# Patient Record
Sex: Male | Born: 1970
Health system: Southern US, Community
[De-identification: ages and names within clinical notes are randomized; demographics above are authoritative.]

## PROBLEM LIST (undated history)

## (undated) DIAGNOSIS — I219 Acute myocardial infarction, unspecified: Secondary | ICD-10-CM

## (undated) DIAGNOSIS — I509 Heart failure, unspecified: Secondary | ICD-10-CM

## (undated) DIAGNOSIS — I10 Essential (primary) hypertension: Secondary | ICD-10-CM

## (undated) DIAGNOSIS — E119 Type 2 diabetes mellitus without complications: Secondary | ICD-10-CM

---

## 2016-06-29 DIAGNOSIS — M109 Gout, unspecified: Secondary | ICD-10-CM | POA: Diagnosis not present

## 2016-06-29 DIAGNOSIS — I509 Heart failure, unspecified: Secondary | ICD-10-CM | POA: Diagnosis not present

## 2016-06-29 DIAGNOSIS — Z79899 Other long term (current) drug therapy: Secondary | ICD-10-CM | POA: Diagnosis not present

## 2016-06-29 DIAGNOSIS — Z7982 Long term (current) use of aspirin: Secondary | ICD-10-CM | POA: Diagnosis not present

## 2016-06-29 DIAGNOSIS — M7989 Other specified soft tissue disorders: Secondary | ICD-10-CM | POA: Diagnosis not present

## 2016-06-29 DIAGNOSIS — I110 Hypertensive heart disease with heart failure: Secondary | ICD-10-CM | POA: Diagnosis not present

## 2016-06-29 DIAGNOSIS — M25572 Pain in left ankle and joints of left foot: Secondary | ICD-10-CM | POA: Diagnosis not present

## 2016-06-29 DIAGNOSIS — M79672 Pain in left foot: Secondary | ICD-10-CM | POA: Diagnosis not present

## 2016-12-15 DIAGNOSIS — I428 Other cardiomyopathies: Secondary | ICD-10-CM | POA: Diagnosis not present

## 2016-12-15 DIAGNOSIS — E669 Obesity, unspecified: Secondary | ICD-10-CM | POA: Diagnosis not present

## 2016-12-15 DIAGNOSIS — R0602 Shortness of breath: Secondary | ICD-10-CM | POA: Diagnosis not present

## 2016-12-15 DIAGNOSIS — Z6836 Body mass index (BMI) 36.0-36.9, adult: Secondary | ICD-10-CM | POA: Diagnosis not present

## 2016-12-15 DIAGNOSIS — R079 Chest pain, unspecified: Secondary | ICD-10-CM | POA: Diagnosis not present

## 2016-12-15 DIAGNOSIS — Z79899 Other long term (current) drug therapy: Secondary | ICD-10-CM | POA: Diagnosis not present

## 2016-12-15 DIAGNOSIS — I5022 Chronic systolic (congestive) heart failure: Secondary | ICD-10-CM | POA: Diagnosis not present

## 2016-12-15 DIAGNOSIS — R0600 Dyspnea, unspecified: Secondary | ICD-10-CM | POA: Diagnosis not present

## 2016-12-15 DIAGNOSIS — I5020 Unspecified systolic (congestive) heart failure: Secondary | ICD-10-CM | POA: Diagnosis not present

## 2016-12-15 DIAGNOSIS — I10 Essential (primary) hypertension: Secondary | ICD-10-CM | POA: Diagnosis not present

## 2016-12-20 DIAGNOSIS — I10 Essential (primary) hypertension: Secondary | ICD-10-CM | POA: Diagnosis not present

## 2016-12-20 DIAGNOSIS — E785 Hyperlipidemia, unspecified: Secondary | ICD-10-CM | POA: Diagnosis not present

## 2016-12-20 DIAGNOSIS — I429 Cardiomyopathy, unspecified: Secondary | ICD-10-CM | POA: Diagnosis not present

## 2016-12-20 DIAGNOSIS — R0609 Other forms of dyspnea: Secondary | ICD-10-CM | POA: Diagnosis not present

## 2016-12-30 DIAGNOSIS — I428 Other cardiomyopathies: Secondary | ICD-10-CM | POA: Diagnosis not present

## 2016-12-30 DIAGNOSIS — R072 Precordial pain: Secondary | ICD-10-CM | POA: Diagnosis not present

## 2016-12-30 DIAGNOSIS — R6889 Other general symptoms and signs: Secondary | ICD-10-CM | POA: Diagnosis not present

## 2016-12-30 DIAGNOSIS — I429 Cardiomyopathy, unspecified: Secondary | ICD-10-CM | POA: Diagnosis not present

## 2016-12-30 DIAGNOSIS — E785 Hyperlipidemia, unspecified: Secondary | ICD-10-CM | POA: Diagnosis not present

## 2016-12-30 DIAGNOSIS — R0609 Other forms of dyspnea: Secondary | ICD-10-CM | POA: Diagnosis not present

## 2016-12-30 DIAGNOSIS — I2510 Atherosclerotic heart disease of native coronary artery without angina pectoris: Secondary | ICD-10-CM | POA: Diagnosis not present

## 2016-12-30 DIAGNOSIS — I517 Cardiomegaly: Secondary | ICD-10-CM | POA: Diagnosis not present

## 2016-12-30 DIAGNOSIS — Z7982 Long term (current) use of aspirin: Secondary | ICD-10-CM | POA: Diagnosis not present

## 2016-12-30 DIAGNOSIS — I119 Hypertensive heart disease without heart failure: Secondary | ICD-10-CM | POA: Diagnosis not present

## 2017-02-17 ENCOUNTER — Emergency Department (HOSPITAL_COMMUNITY)
Admission: EM | Admit: 2017-02-17 | Discharge: 2017-02-17 | Disposition: A | Discharge status: Home / Self Care | Payer: BLUE CROSS/BLUE SHIELD | Attending: Emergency Medicine | Admitting: Emergency Medicine

## 2017-02-17 ENCOUNTER — Encounter (HOSPITAL_COMMUNITY): Payer: Self-pay | Admitting: Emergency Medicine

## 2017-02-17 ENCOUNTER — Ambulatory Visit (HOSPITAL_COMMUNITY): Admission: EM | Admit: 2017-02-17 | Discharge: 2017-02-17 | Payer: BLUE CROSS/BLUE SHIELD | Source: Home / Self Care

## 2017-02-17 DIAGNOSIS — T783XXA Angioneurotic edema, initial encounter: Secondary | ICD-10-CM | POA: Diagnosis not present

## 2017-02-17 HISTORY — DX: Heart failure, unspecified: I50.9

## 2017-02-17 HISTORY — DX: Essential (primary) hypertension: I10

## 2017-02-17 HISTORY — DX: Type 2 diabetes mellitus without complications: E11.9

## 2017-02-17 HISTORY — DX: Acute myocardial infarction, unspecified: I21.9

## 2017-02-17 MED ORDER — METHYLPREDNISOLONE SODIUM SUCC 125 MG IJ SOLR
125.0000 mg | Freq: Once | INTRAMUSCULAR | Status: AC
  Administered 2017-02-17: 125 mg via INTRAVENOUS
  Filled 2017-02-17: qty 2

## 2017-02-17 MED ORDER — METFORMIN HCL 500 MG PO TABS: 500.0000 mg | ORAL_TABLET | Freq: Two times a day (BID) | ORAL | 1 refills | Status: DC

## 2017-02-17 MED ORDER — AMLODIPINE BESYLATE 5 MG PO TABS: 5.0000 mg | ORAL_TABLET | Freq: Every day | ORAL | 1 refills | Status: DC

## 2017-02-17 MED ORDER — SODIUM CHLORIDE 0.9 % IV BOLUS (SEPSIS)
1000.0000 mL | Freq: Once | INTRAVENOUS | Status: AC
  Administered 2017-02-17: 1000 mL via INTRAVENOUS

## 2017-02-17 NOTE — ED Triage Notes (Signed)
Pt to ER for acute angioedema onset on awakening this morning. States has been on lisinopril for "years." states took a benadryl 9 am this morning as well as 1 pm with some improvement. Went to [facility] and was sent here. Airway intact.

## 2017-02-17 NOTE — ED Provider Notes (Signed)
[facility] EMERGENCY DEPARTMENT Provider Note CSN: [id number] Arrival date & time: 02/17/17  1844 History   Chief Complaint Chief Complaint  Patient presents with  . Angioedema   HPI Odilon Vandekamp is a 46 y.o. male past medical history of hypertension who presented with angioedema.  Patient noted symptoms upon awakening this morning approximately 12 hours ago.  Patient's family member states that she gave patient 2 Benadryl and told him to get dressed to go to the hospital however patient went to sleep instead.  Upon waking up about noon patient swelling had worsened however he still refused to come to the hospital.  Patient finally agreed to go to urgent care this evening where he was seen and evaluated and advised to come to the emergency department for further evaluation.  At this time patient's daughter states that the swelling has significantly improved compared to what it was earlier.  The patient denies that he has had any associated chest pain, shortness of breath, difficulty breathing, throat swelling, itchy or scratchy throat.  Denies any associated rashes, nausea, vomiting, abdominal pain, or diarrhea.  He has not had any recent fevers or chills.  Patient does take lisinopril and has no known allergies.  HPI  History reviewed. No pertinent past medical history.  There are no active problems to display for this patient.  History reviewed. No pertinent surgical history.  Home Medications    Prior to Admission medications   Not on File    Family History History reviewed. No pertinent family history.  Social History Social History  Substance Use Topics  . Smoking status: Never Smoker  . Smokeless tobacco: Never Used  . Alcohol use Not on file   Allergies   Patient has no allergy information on record.  Review of Systems Review of Systems  Constitutional: Negative for chills and fever.  HENT: Positive for facial swelling. Negative for drooling, ear  pain and sore throat.   Eyes: Negative for pain and visual disturbance.  Respiratory: Negative for cough, choking, chest tightness, shortness of breath and stridor.   Cardiovascular: Negative for chest pain and palpitations.  Gastrointestinal: Negative for abdominal pain and vomiting.  Genitourinary: Negative for dysuria and hematuria.  Musculoskeletal: Negative for arthralgias and back pain.  Skin: Negative for color change and rash.  Neurological: Negative for seizures and syncope.  All other systems reviewed and are negative.  Physical Exam Updated Vital Signs BP 118/74   Pulse 71   Temp 97.9 F (36.6 C) (Oral)   Resp 18   SpO2 95%   Physical Exam  Constitutional: He appears well-developed and well-nourished.  HENT:  Head: Normocephalic and atraumatic.  Mouth/Throat: No uvula swelling.  Significant swelling of the lips without involvement of the tongue or oral pharynx no swelling of the uvula noted.  Patient with a patent airway  Eyes: Conjunctivae are normal.  Neck: Neck supple.  Cardiovascular: Normal rate and regular rhythm.   No murmur heard. Pulmonary/Chest: Effort normal and breath sounds normal. No respiratory distress.  Without difficulty breathing no signs of respiratory distress at this time  Abdominal: Soft. There is no tenderness.  Musculoskeletal: He exhibits no edema.  Neurological: He is alert.  Skin: Skin is warm and dry.  Psychiatric: He has a normal mood and affect.  Nursing note and vitals reviewed.  ED Treatments / Results  Labs (all labs ordered are listed, but only abnormal results are displayed) Labs Reviewed - No data to display  EKG  EKG Interpretation  None      Radiology No results found.  Procedures Procedures (including critical care time)  Medications Ordered in ED Medications  methylPREDNISolone sodium succinate (SOLU-MEDROL) 125 mg/2 mL injection 125 mg (not administered)  sodium chloride 0.9 % bolus 1,000 mL (not  administered)   Initial Impression / Assessment and Plan / ED Course  I have reviewed the triage vital signs and the nursing notes.  Pertinent labs & imaging results that were available during my care of the patient were reviewed by me and considered in my medical decision making (see chart for details).  Dorien ChihuahuaBenny Woloszyn is a 46 y.o. male with PMH of HTN who presents who presents with facial swelling concerning for angioedema.  Patient has not self medicine at home with Benadryl.  Symptoms have improved since the onset early this morning.  Patient given IV fluids and solid nodule in the emergency department and observed for 2 hours.   Patient without any difficulty breathing, no respiratory distress or signs of impending airway collapse.  Discussed discontinuing lisinopril with the patient and following up his provider for alternative hypertensive medication.  Will prescribe the patient Norvasc in the time being until he is able to his PCP for blood pressure control.  Patient also given Rx for metformin.  Patient discharged in stable condition.  Advised to return to emergency department for any new or worsening symptoms.  Advised to follow-up with his PCP in 3-5 days.  Final Clinical Impressions(s) / ED Diagnoses   Final diagnoses:  Angioedema, initial encounter    New Prescriptions New Prescriptions   No medications on file     Lamont SnowballGarza, Angala Hilgers, MD 02/19/17 1315    Raeford RazorKohut, Stephen, MD 03/04/17 [phone]

## 2017-02-17 NOTE — ED Notes (Signed)
Patient able to ambulate independently to bed from wheelchair, gait steady and even, NAD

## 2017-02-21 DIAGNOSIS — I10 Essential (primary) hypertension: Secondary | ICD-10-CM | POA: Diagnosis not present

## 2017-02-21 DIAGNOSIS — I5022 Chronic systolic (congestive) heart failure: Secondary | ICD-10-CM | POA: Diagnosis not present

## 2017-02-21 DIAGNOSIS — E669 Obesity, unspecified: Secondary | ICD-10-CM | POA: Diagnosis not present

## 2017-02-21 DIAGNOSIS — E119 Type 2 diabetes mellitus without complications: Secondary | ICD-10-CM | POA: Diagnosis not present

## 2017-02-21 DIAGNOSIS — I119 Hypertensive heart disease without heart failure: Secondary | ICD-10-CM | POA: Diagnosis not present

## 2017-02-24 ENCOUNTER — Emergency Department (HOSPITAL_COMMUNITY)
Admission: EM | Admit: 2017-02-24 | Discharge: 2017-02-25 | Disposition: A | Discharge status: Home / Self Care | Payer: BLUE CROSS/BLUE SHIELD | Attending: Emergency Medicine | Admitting: Emergency Medicine

## 2017-02-24 ENCOUNTER — Encounter (HOSPITAL_COMMUNITY): Payer: Self-pay

## 2017-02-24 ENCOUNTER — Emergency Department (HOSPITAL_COMMUNITY): Payer: BLUE CROSS/BLUE SHIELD

## 2017-02-24 ENCOUNTER — Other Ambulatory Visit: Payer: Self-pay

## 2017-02-24 DIAGNOSIS — I110 Hypertensive heart disease with heart failure: Secondary | ICD-10-CM | POA: Diagnosis not present

## 2017-02-24 DIAGNOSIS — R0789 Other chest pain: Secondary | ICD-10-CM

## 2017-02-24 DIAGNOSIS — E119 Type 2 diabetes mellitus without complications: Secondary | ICD-10-CM | POA: Insufficient documentation

## 2017-02-24 DIAGNOSIS — R0602 Shortness of breath: Secondary | ICD-10-CM | POA: Diagnosis not present

## 2017-02-24 DIAGNOSIS — Z7984 Long term (current) use of oral hypoglycemic drugs: Secondary | ICD-10-CM | POA: Diagnosis not present

## 2017-02-24 DIAGNOSIS — R9431 Abnormal electrocardiogram [ECG] [EKG]: Secondary | ICD-10-CM | POA: Diagnosis not present

## 2017-02-24 DIAGNOSIS — I252 Old myocardial infarction: Secondary | ICD-10-CM | POA: Diagnosis not present

## 2017-02-24 DIAGNOSIS — Z79899 Other long term (current) drug therapy: Secondary | ICD-10-CM | POA: Diagnosis not present

## 2017-02-24 DIAGNOSIS — F10920 Alcohol use, unspecified with intoxication, uncomplicated: Secondary | ICD-10-CM

## 2017-02-24 DIAGNOSIS — R4182 Altered mental status, unspecified: Secondary | ICD-10-CM | POA: Diagnosis present

## 2017-02-24 DIAGNOSIS — I509 Heart failure, unspecified: Secondary | ICD-10-CM | POA: Diagnosis not present

## 2017-02-24 DIAGNOSIS — R079 Chest pain, unspecified: Secondary | ICD-10-CM | POA: Insufficient documentation

## 2017-02-24 DIAGNOSIS — R402441 Other coma, without documented Glasgow coma scale score, or with partial score reported, in the field [EMT or ambulance]: Secondary | ICD-10-CM | POA: Diagnosis not present

## 2017-02-24 LAB — MAGNESIUM: Magnesium: 1.6 mg/dL — ABNORMAL LOW (ref 1.7–2.4)

## 2017-02-24 LAB — CBC WITH DIFFERENTIAL/PLATELET
BASOS ABS: 0 10*3/uL (ref 0.0–0.1)
Basophils Relative: 0 %
Eosinophils Absolute: 0.2 10*3/uL (ref 0.0–0.7)
Eosinophils Relative: 3 %
HEMATOCRIT: 41.7 % (ref 39.0–52.0)
HEMOGLOBIN: 13.6 g/dL (ref 13.0–17.0)
LYMPHS ABS: 3.6 10*3/uL (ref 0.7–4.0)
LYMPHS PCT: 49 %
MCH: 26.9 pg (ref 26.0–34.0)
MCHC: 32.6 g/dL (ref 30.0–36.0)
MCV: 82.4 fL (ref 78.0–100.0)
Monocytes Absolute: 0.5 10*3/uL (ref 0.1–1.0)
Monocytes Relative: 7 %
NEUTROS ABS: 3 10*3/uL (ref 1.7–7.7)
NEUTROS PCT: 41 %
PLATELETS: 182 10*3/uL (ref 150–400)
RBC: 5.06 MIL/uL (ref 4.22–5.81)
RDW: 14 % (ref 11.5–15.5)
WBC: 7.4 10*3/uL (ref 4.0–10.5)

## 2017-02-24 LAB — COMPREHENSIVE METABOLIC PANEL
ALBUMIN: 3.3 g/dL — AB (ref 3.5–5.0)
ALT: 11 U/L — AB (ref 17–63)
AST: 10 U/L — AB (ref 15–41)
Alkaline Phosphatase: 63 U/L (ref 38–126)
Anion gap: 9 (ref 5–15)
BILIRUBIN TOTAL: 0.6 mg/dL (ref 0.3–1.2)
BUN: 11 mg/dL (ref 6–20)
CHLORIDE: 110 mmol/L (ref 101–111)
CO2: 20 mmol/L — ABNORMAL LOW (ref 22–32)
Calcium: 7.3 mg/dL — ABNORMAL LOW (ref 8.9–10.3)
Creatinine, Ser: 1.18 mg/dL (ref 0.61–1.24)
GFR calc Af Amer: >60 mL/min (ref >60)
Glucose, Bld: 99 mg/dL (ref 65–99)
POTASSIUM: 2.8 mmol/L — AB (ref 3.5–5.1)
Sodium: 139 mmol/L (ref 135–145)
TOTAL PROTEIN: 5.9 g/dL — AB (ref 6.5–8.1)

## 2017-02-24 LAB — I-STAT TROPONIN, ED: TROPONIN I, POC: 0 ng/mL (ref 0.00–0.08)

## 2017-02-24 LAB — CBG MONITORING, ED: Glucose-Capillary: 104 mg/dL — ABNORMAL HIGH (ref 65–99)

## 2017-02-24 LAB — ETHANOL: Alcohol, Ethyl (B): 266 mg/dL — ABNORMAL HIGH (ref <10)

## 2017-02-24 MED ORDER — POTASSIUM CHLORIDE CRYS ER 20 MEQ PO TBCR
40.0000 meq | EXTENDED_RELEASE_TABLET | Freq: Once | ORAL | Status: AC
  Administered 2017-02-24: 40 meq via ORAL
  Filled 2017-02-24: qty 2

## 2017-02-24 NOTE — ED Triage Notes (Addendum)
Pt arrives to ed via ems with complaints of "unresponsive". Friend at bedside state he drank about 4oz of etoh when he started complaining of chest pain and became unresponsive. In route to ed ems states pt was only responsive to painful stimuli. On arrival pt is alert but confused and asking repetitive questions.  4mg  zofran given pta.  Pt denies pain on arrival to ED and does not remember having pain pta

## 2017-02-24 NOTE — ED Provider Notes (Signed)
[facility] Provider Note   CSN: [id number] Arrival date & time: 02/24/17  2110   Mental status history is obtained from patient's fianc who accompanies him  History   Chief Complaint Chief Complaint  Patient presents with  . Altered Mental Status    HPI Dustin Nicholson is a 46 y.o. male.  HPI Patient drank several shots of liquor earlier tonight approximately 8:15 PM.  He went upstairs and was found to be unresponsive.  Upon awakening patient complained of chest pain.  His fiance says he "never drinks alcohol".  No treatment prior to coming here.  Nothing makes symptoms better or worse Past Medical History:  Diagnosis Date  . CHF (congestive heart failure) (HCC)   . 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 Hypertension   . 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   History reviewed. No pertinent surgical history.     Home Medications    Prior to Admission medications   Medication Sig Start Date End Date Taking? Authorizing Provider  amLODipine (NORVASC) 5 MG tablet Take 1 tablet (5 mg total) by mouth daily. 02/17/17 04/18/17  Lamont SnowballGarza, Anna, MD  metFORMIN (GLUCOPHAGE) 500 MG tablet Take 1 tablet (500 mg total) by mouth 2 (two) times daily with a meal. 02/17/17 04/18/17  Lamont SnowballGarza, Anna, MD   Hydralazine, spironolactone carvedilol, Lasix, atorvastatin, amlodipine, isosorbide mononitrate ER Family History No family history on file.  Social History Social History   Tobacco Use  . Smoking status: Never Smoker  . Smokeless tobacco: Never Used  Substance Use Topics  . Alcohol use: Yes  . Drug use: No     Allergies   Shellfish allergy   Review of Systems Review of Systems  Unable to perform ROS: Mental status change  Cardiovascular: Positive for chest pain.  Allergic/Immunologic: Positive for immunocompromised  state.       Diabetic     Physical Exam Updated Vital Signs BP 128/87   Pulse 72   Temp 98.8 F (37.1 C) (Oral)   Resp 14   Ht 5\' 11"  (1.803 m)   Wt 116.1 kg (256 lb)   SpO2 100%   BMI 35.70 kg/m   Physical Exam  Constitutional: He appears well-developed and well-nourished.  Sleepy arousable to tactile stimulus  HENT:  Head: Normocephalic and atraumatic.  Eyes: Conjunctivae are normal. Pupils are equal, round, and reactive to light.  Neck: Neck supple. No tracheal deviation present. No thyromegaly present.  Cardiovascular: Normal rate and regular rhythm.  No murmur heard. Pulmonary/Chest: Effort normal and breath sounds normal.  Abdominal: Soft. Bowel sounds are normal. He exhibits no distension. There is no tenderness.  Obese  Musculoskeletal: Normal range of motion. He exhibits no edema or tenderness.  Neurological: Coordination normal.  Sleepy arousable to tactile stimulus.  Moves all extremities cranial nerves II through XII grossly intact  Skin: Skin is warm and dry. No rash noted.  Psychiatric: He has a normal mood and affect.  Nursing note and vitals reviewed.    ED Treatments / Results  Labs (all labs ordered are listed, but only abnormal results are displayed) Labs Reviewed  CBG MONITORING, ED - Abnormal; Notable for the following components:      Result Value   Glucose-Capillary 104 (*)    All other components within normal limits  COMPREHENSIVE METABOLIC PANEL  CBC  WITH DIFFERENTIAL/PLATELET  ETHANOL  I-STAT TROPONIN, ED    EKG  EKG Interpretation None       Radiology No results found.  Procedures Procedures (including critical care time)  Medications Ordered in ED Medications - No data to display Chest x-ray reviewed by me Results for orders placed or performed during the hospital encounter of 02/24/17  Comprehensive metabolic panel  Result Value Ref Range   Sodium 139 135 - 145 mmol/L   Potassium 2.8 (L) 3.5 - 5.1 mmol/L    Chloride 110 101 - 111 mmol/L   CO2 20 (L) 22 - 32 mmol/L   Glucose, Bld 99 65 - 99 mg/dL   BUN 11 6 - 20 mg/dL   Creatinine, Ser 3.15 0.61 - 1.24 mg/dL   Calcium 7.3 (L) 8.9 - 10.3 mg/dL   Total Protein 5.9 (L) 6.5 - 8.1 g/dL   Albumin 3.3 (L) 3.5 - 5.0 g/dL   AST 10 (L) 15 - 41 U/L   ALT 11 (L) 17 - 63 U/L   Alkaline Phosphatase 63 38 - 126 U/L   Total Bilirubin 0.6 0.3 - 1.2 mg/dL   GFR calc non Af Amer >60 >60 mL/min   GFR calc Af Amer >60 >60 mL/min   Anion gap 9 5 - 15  CBC with Differential/Platelet  Result Value Ref Range   WBC 7.4 4.0 - 10.5 K/uL   RBC 5.06 4.22 - 5.81 MIL/uL   Hemoglobin 13.6 13.0 - 17.0 g/dL   HCT 94.5 85.9 - 29.2 %   MCV 82.4 78.0 - 100.0 fL   MCH 26.9 26.0 - 34.0 pg   MCHC 32.6 30.0 - 36.0 g/dL   RDW 44.6 28.6 - 38.1 %   Platelets 182 150 - 400 K/uL   Neutrophils Relative % 41 %   Neutro Abs 3.0 1.7 - 7.7 K/uL   Lymphocytes Relative 49 %   Lymphs Abs 3.6 0.7 - 4.0 K/uL   Monocytes Relative 7 %   Monocytes Absolute 0.5 0.1 - 1.0 K/uL   Eosinophils Relative 3 %   Eosinophils Absolute 0.2 0.0 - 0.7 K/uL   Basophils Relative 0 %   Basophils Absolute 0.0 0.0 - 0.1 K/uL  Ethanol  Result Value Ref Range   Alcohol, Ethyl (B) 266 (H) <10 mg/dL  Magnesium  Result Value Ref Range   Magnesium 1.6 (L) 1.7 - 2.4 mg/dL  CBG monitoring, ED  Result Value Ref Range   Glucose-Capillary 104 (H) 65 - 99 mg/dL  I-stat troponin, ED  Result Value Ref Range   Troponin i, poc 0.00 0.00 - 0.08 ng/mL   Comment 3           Dg Chest Port 1 View  Result Date: 02/24/2017 CLINICAL DATA:  Mid left chest pain with shortness of breath starting tonight. EXAM: PORTABLE CHEST 1 VIEW COMPARISON:  None. FINDINGS: Cardiac enlargement. No vascular congestion or edema. Lungs are clear. No airspace disease or consolidation. No blunting of costophrenic angles. No pneumothorax. IMPRESSION: No active disease. Electronically Signed   By: Burman Nieves M.D.   On: 02/24/2017 22:26     Chest x-ray viewed by me Results for orders placed or performed during the hospital encounter of 02/24/17  Comprehensive metabolic panel  Result Value Ref Range   Sodium 139 135 - 145 mmol/L   Potassium 2.8 (L) 3.5 - 5.1 mmol/L   Chloride 110 101 - 111 mmol/L   CO2 20 (L) 22 - 32 mmol/L   Glucose,  Bld 99 65 - 99 mg/dL   BUN 11 6 - 20 mg/dL   Creatinine, Ser 1.61 0.61 - 1.24 mg/dL   Calcium 7.3 (L) 8.9 - 10.3 mg/dL   Total Protein 5.9 (L) 6.5 - 8.1 g/dL   Albumin 3.3 (L) 3.5 - 5.0 g/dL   AST 10 (L) 15 - 41 U/L   ALT 11 (L) 17 - 63 U/L   Alkaline Phosphatase 63 38 - 126 U/L   Total Bilirubin 0.6 0.3 - 1.2 mg/dL   GFR calc non Af Amer >60 >60 mL/min   GFR calc Af Amer >60 >60 mL/min   Anion gap 9 5 - 15  CBC with Differential/Platelet  Result Value Ref Range   WBC 7.4 4.0 - 10.5 K/uL   RBC 5.06 4.22 - 5.81 MIL/uL   Hemoglobin 13.6 13.0 - 17.0 g/dL   HCT 09.6 04.5 - 40.9 %   MCV 82.4 78.0 - 100.0 fL   MCH 26.9 26.0 - 34.0 pg   MCHC 32.6 30.0 - 36.0 g/dL   RDW 81.1 91.4 - 78.2 %   Platelets 182 150 - 400 K/uL   Neutrophils Relative % 41 %   Neutro Abs 3.0 1.7 - 7.7 K/uL   Lymphocytes Relative 49 %   Lymphs Abs 3.6 0.7 - 4.0 K/uL   Monocytes Relative 7 %   Monocytes Absolute 0.5 0.1 - 1.0 K/uL   Eosinophils Relative 3 %   Eosinophils Absolute 0.2 0.0 - 0.7 K/uL   Basophils Relative 0 %   Basophils Absolute 0.0 0.0 - 0.1 K/uL  Ethanol  Result Value Ref Range   Alcohol, Ethyl (B) 266 (H) <10 mg/dL  Magnesium  Result Value Ref Range   Magnesium 1.6 (L) 1.7 - 2.4 mg/dL  CBG monitoring, ED  Result Value Ref Range   Glucose-Capillary 104 (H) 65 - 99 mg/dL  I-stat troponin, ED  Result Value Ref Range   Troponin i, poc 0.00 0.00 - 0.08 ng/mL   Comment 3           Dg Chest Port 1 View  Result Date: 02/24/2017 CLINICAL DATA:  Mid left chest pain with shortness of breath starting tonight. EXAM: PORTABLE CHEST 1 VIEW COMPARISON:  None. FINDINGS: Cardiac enlargement. No  vascular congestion or edema. Lungs are clear. No airspace disease or consolidation. No blunting of costophrenic angles. No pneumothorax. IMPRESSION: No active disease. Electronically Signed   By: Burman Nieves M.D.   On: 02/24/2017 22:26   Initial Impression / Assessment and Plan / ED Course  I have reviewed the triage vital signs and the nursing notes.  Pertinent labs & imaging results that were available during my care of the patient were reviewed by me and considered in my medical decision making (see chart for details).     12:10 AM patient sleeping comfortably.  Oral potassium and oral magnesium supplementation ordered. Patient signed out to Dr. Wilkie Aye 12:15 AM f Final Clinical Impressions(s) / ED Diagnoses  Diagnosis #1 alcohol intoxication #2 chest pain #3  Hypokalemia #4 hypomagnesemia Final diagnoses:  None    ED Discharge Orders    None       Doug Sou, MD 02/25/17 [phone]

## 2017-02-25 LAB — I-STAT TROPONIN, ED: TROPONIN I, POC: 0.01 ng/mL (ref 0.00–0.08)

## 2017-02-25 MED ORDER — MAGNESIUM OXIDE 400 (241.3 MG) MG PO TABS
800.0000 mg | ORAL_TABLET | Freq: Once | ORAL | Status: AC
  Administered 2017-02-25: 800 mg via ORAL
  Filled 2017-02-25: qty 2

## 2017-02-25 NOTE — ED Provider Notes (Signed)
Patient signed out pending repeat troponin.  Repeat troponin negative.  On recheck he is resting comfortably.  He is arousable.  He has ambulated.  Denies any chest pain at this time.  Follow-up with cardiology given.  After history, exam, and medical workup I feel the patient has been appropriately medically screened and is safe for discharge home. Pertinent diagnoses were discussed with the patient. Patient was given return precautions.    Physical Exam  BP 135/88   Pulse 65   Temp 98.8 F (37.1 C) (Oral)   Resp 11   Ht 5\' 11"  (1.803 m)   Wt 116.1 kg (256 lb)   SpO2 95%   BMI 35.70 kg/m      Shon Baton, MD 02/25/17 [phone]

## 2018-05-04 ENCOUNTER — Emergency Department (HOSPITAL_COMMUNITY): Payer: Self-pay

## 2018-05-04 ENCOUNTER — Other Ambulatory Visit: Payer: Self-pay

## 2018-05-04 ENCOUNTER — Encounter (HOSPITAL_COMMUNITY): Payer: Self-pay

## 2018-05-04 ENCOUNTER — Emergency Department (HOSPITAL_COMMUNITY)
Admission: EM | Admit: 2018-05-04 | Discharge: 2018-05-04 | Disposition: A | Discharge status: Home / Self Care | Payer: Self-pay | Attending: Emergency Medicine | Admitting: Emergency Medicine

## 2018-05-04 DIAGNOSIS — R0602 Shortness of breath: Secondary | ICD-10-CM | POA: Diagnosis not present

## 2018-05-04 DIAGNOSIS — Z79899 Other long term (current) drug therapy: Secondary | ICD-10-CM | POA: Insufficient documentation

## 2018-05-04 DIAGNOSIS — E119 Type 2 diabetes mellitus without complications: Secondary | ICD-10-CM | POA: Insufficient documentation

## 2018-05-04 DIAGNOSIS — Z7984 Long term (current) use of oral hypoglycemic drugs: Secondary | ICD-10-CM | POA: Insufficient documentation

## 2018-05-04 DIAGNOSIS — Z7982 Long term (current) use of aspirin: Secondary | ICD-10-CM | POA: Insufficient documentation

## 2018-05-04 DIAGNOSIS — I110 Hypertensive heart disease with heart failure: Secondary | ICD-10-CM | POA: Insufficient documentation

## 2018-05-04 DIAGNOSIS — I509 Heart failure, unspecified: Secondary | ICD-10-CM | POA: Insufficient documentation

## 2018-05-04 LAB — BASIC METABOLIC PANEL
Anion gap: 7 (ref 5–15)
BUN: 13 mg/dL (ref 6–20)
CALCIUM: 8.6 mg/dL — AB (ref 8.9–10.3)
CO2: 24 mmol/L (ref 22–32)
Chloride: 108 mmol/L (ref 98–111)
Creatinine, Ser: 1.23 mg/dL (ref 0.61–1.24)
GFR calc non Af Amer: >60 mL/min (ref >60)
GLUCOSE: 107 mg/dL — AB (ref 70–99)
Potassium: 3.3 mmol/L — ABNORMAL LOW (ref 3.5–5.1)
Sodium: 139 mmol/L (ref 135–145)

## 2018-05-04 LAB — CBC
HEMATOCRIT: 45.8 % (ref 39.0–52.0)
Hemoglobin: 14.2 g/dL (ref 13.0–17.0)
MCH: 27.5 pg (ref 26.0–34.0)
MCHC: 31 g/dL (ref 30.0–36.0)
MCV: 88.8 fL (ref 80.0–100.0)
NRBC: 0 % (ref 0.0–0.2)
Platelets: 198 10*3/uL (ref 150–400)
RBC: 5.16 MIL/uL (ref 4.22–5.81)
RDW: 14.4 % (ref 11.5–15.5)
WBC: 4.9 10*3/uL (ref 4.0–10.5)

## 2018-05-04 MED ORDER — HYDRALAZINE HCL 25 MG PO TABS: 25.0000 mg | ORAL_TABLET | Freq: Two times a day (BID) | ORAL | 0 refills | Status: DC

## 2018-05-04 MED ORDER — GABAPENTIN 300 MG PO CAPS: 300.0000 mg | ORAL_CAPSULE | Freq: Three times a day (TID) | ORAL | 0 refills | Status: DC

## 2018-05-04 MED ORDER — ASPIRIN 81 MG PO CHEW: 81.0000 mg | CHEWABLE_TABLET | Freq: Every day | ORAL | 0 refills | Status: DC

## 2018-05-04 MED ORDER — IBUPROFEN 800 MG PO TABS: 800.0000 mg | ORAL_TABLET | Freq: Three times a day (TID) | ORAL | 0 refills | Status: AC

## 2018-05-04 MED ORDER — AMLODIPINE BESYLATE 10 MG PO TABS: 10.0000 mg | ORAL_TABLET | Freq: Every day | ORAL | 0 refills | Status: DC

## 2018-05-04 MED ORDER — METFORMIN HCL 500 MG PO TABS: 500.0000 mg | ORAL_TABLET | Freq: Two times a day (BID) | ORAL | 1 refills | Status: DC

## 2018-05-04 MED ORDER — ATORVASTATIN CALCIUM 20 MG PO TABS: 20.0000 mg | ORAL_TABLET | Freq: Every day | ORAL | 0 refills | Status: DC

## 2018-05-04 MED ORDER — FUROSEMIDE 40 MG PO TABS: 40.0000 mg | ORAL_TABLET | Freq: Every day | ORAL | 0 refills | Status: DC

## 2018-05-04 MED ORDER — SPIRONOLACTONE 25 MG PO TABS: 25.0000 mg | ORAL_TABLET | Freq: Every day | ORAL | 0 refills | Status: DC

## 2018-05-04 MED ORDER — CARVEDILOL 25 MG PO TABS: 25.0000 mg | ORAL_TABLET | Freq: Two times a day (BID) | ORAL | 0 refills | Status: DC

## 2018-05-04 NOTE — ED Notes (Signed)
Patient verbalizes understanding of discharge instructions. Opportunity for questioning and answers were provided. 

## 2018-05-04 NOTE — ED Triage Notes (Signed)
Pt here with shortness of breath associated with hx of CHF. Moved back to [employer] and out of all medications. Pt states he takes a water pill but cannot remember the name.  A&Ox4.  Only short of breath with movement.

## 2018-05-04 NOTE — ED Provider Notes (Signed)
[facility] EMERGENCY DEPARTMENT Provider Note   CSN: [id number] Arrival date & time: 05/04/18  1529     History   Chief Complaint Chief Complaint  Patient presents with  . Shortness of Breath    HPI Dustin Nicholson is a 48 y.o. male.  The history is provided by the patient.  Shortness of Breath  Severity:  Mild Onset quality:  Gradual Progression:  Unchanged Chronicity:  Chronic Context comment:  Hx of HTN/HF and recently moved back and does not have medications. Has not had any for 4-5 days. No symptoms. No chest pain, no SOB.  Relieved by:  Nothing Worsened by:  Nothing Ineffective treatments:  None tried Associated symptoms: no abdominal pain, no chest pain, no cough, no ear pain, no fever, no rash, no sore throat and no vomiting   Risk factors comment:  HTN, CHF   Past Medical History:  Diagnosis Date  . CHF (congestive heart failure) (HCC)   . 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 Hypertension   . 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   History reviewed. No pertinent surgical history.      Home Medications    Prior to Admission medications   Medication Sig Start Date End Date Taking? Authorizing Provider  amLODipine (NORVASC) 10 MG tablet Take 1 tablet (10 mg total) by mouth daily for 30 days. 05/04/18 06/03/18  Johnice Riebe, DO  aspirin 81 MG chewable tablet Chew 1 tablet (81 mg total) by mouth daily for 30 days. 05/04/18 06/03/18  Chaz Ronning, DO  atorvastatin (LIPITOR) 20 MG tablet Take 1 tablet (20 mg total) by mouth daily for 30 days. 05/04/18 06/03/18  Iveliz Garay, DO  carvedilol (COREG) 25 MG tablet Take 1 tablet (25 mg total) by mouth 2 (two) times daily with a meal for 30 days. 05/04/18 06/03/18  Mackenzi Krogh, DO  furosemide (LASIX) 40 MG tablet Take 1 tablet (40 mg total) by mouth daily for 30 days. 05/04/18  06/03/18  Mellisa Arshad, DO  hydrALAZINE (APRESOLINE) 25 MG tablet Take 1 tablet (25 mg total) by mouth 2 (two) times daily for 30 days. 05/04/18 06/03/18  Rondi Ivy, DO  isosorbide mononitrate (IMDUR) 30 MG 24 hr tablet Take 30 mg daily by mouth.    [provider]  metFORMIN (GLUCOPHAGE) 500 MG tablet Take 1 tablet (500 mg total) by mouth 2 (two) times daily with a meal. 05/04/18 07/03/18  Teshawn Moan, DO  spironolactone (ALDACTONE) 25 MG tablet Take 1 tablet (25 mg total) by mouth daily for 30 days. 05/04/18 06/03/18  Virgina Norfolk, DO    Family History History reviewed. No pertinent family history.  Social History Social History   Tobacco Use  . Smoking status: Never Smoker  . Smokeless tobacco: Never Used  Substance Use Topics  . Alcohol use: Yes  . Drug use: No     Allergies   Shellfish allergy   Review of Systems Review of Systems  Constitutional: Negative for chills and fever.  HENT: Negative for ear pain and sore throat.   Eyes: Negative for pain and visual disturbance.  Respiratory: Positive for shortness of breath. Negative for cough.   Cardiovascular: Negative for chest pain and palpitations.  Gastrointestinal: Negative for abdominal pain and vomiting.  Genitourinary: Negative for dysuria and hematuria.  Musculoskeletal: Negative for arthralgias and back pain.  Skin: Negative for color change and rash.  Neurological: Negative for seizures and syncope.  All other systems reviewed and are negative.    Physical Exam Updated Vital Signs  ED Triage Vitals  Enc Vitals Group     BP 05/04/18 1601 (!) 188/116     Pulse Rate 05/04/18 1601 71     Resp 05/04/18 1601 16     Temp 05/04/18 1601 98.5 F (36.9 C)     Temp Source 05/04/18 1601 Oral     SpO2 05/04/18 1601 93 %     Weight --      Height --      Head Circumference --      Peak Flow --      Pain Score 05/04/18 1646 0     Pain Loc --      Pain Edu? --      Excl. in GC? --     Physical  Exam Vitals signs and nursing note reviewed.  Constitutional:      Appearance: He is well-developed.  HENT:     Head: Normocephalic and atraumatic.  Eyes:     Conjunctiva/sclera: Conjunctivae normal.     Pupils: Pupils are equal, round, and reactive to light.  Neck:     Musculoskeletal: Normal range of motion and neck supple.  Cardiovascular:     Rate and Rhythm: Normal rate and regular rhythm.     Pulses: Normal pulses.     Heart sounds: Normal heart sounds. No murmur.  Pulmonary:     Effort: Pulmonary effort is normal. No respiratory distress.     Breath sounds: Normal breath sounds. No decreased breath sounds, wheezing, rhonchi or rales.  Abdominal:     Palpations: Abdomen is soft.     Tenderness: There is no abdominal tenderness.  Musculoskeletal: Normal range of motion.     Right lower leg: Edema present.     Left lower leg: Edema present.  Skin:    General: Skin is warm and dry.     Capillary Refill: Capillary refill takes less than 2 seconds.  Neurological:     General: No focal deficit present.     Mental Status: He is alert.  Psychiatric:        Mood and Affect: Mood normal.      ED Treatments / Results  Labs (all labs ordered are listed, but only abnormal results are displayed) Labs Reviewed  BASIC METABOLIC PANEL - Abnormal; Notable for the following components:      Result Value   Potassium 3.3 (*)    Glucose, Bld 107 (*)    Calcium 8.6 (*)    All other components within normal limits  CBC    EKG EKG Interpretation  Date/Time:  Monday May 04 2018 16:42:40 EST Ventricular Rate:  66 PR Interval:  184 QRS Duration: 94 QT Interval:  422 QTC Calculation: 442 R Axis:   -3 Text Interpretation:  Normal sinus rhythm T wave abnormality, consider lateral ischemia Abnormal ECG No significant change since last tracing Confirmed by Virgina NorfolkAdam, Aaryn Sermon [phone]([id number]) on 05/04/2018 6:44:16 PM   Radiology Dg Chest 2 View  Result Date: 05/04/2018 CLINICAL DATA:   Shortness of Breath EXAM: CHEST - 2 VIEW COMPARISON:  March 06, 2017 FINDINGS: There is no edema or consolidation. Heart is enlarged with pulmonary venous hypertension. No adenopathy. No bone lesions. IMPRESSION: Pulmonary vascular congestion without evident edema or consolidation. Electronically Signed   By: Bretta BangWilliam  Woodruff III M.D.   On: 05/04/2018 17:22  Procedures Procedures (including critical care time)  Medications Ordered in ED Medications - No data to display   Initial Impression / Assessment and Plan / ED Course  I have reviewed the triage vital signs and the nursing notes.  Pertinent labs & imaging results that were available during my care of the patient were reviewed by me and considered in my medical decision making (see chart for details).     Dustin Nicholson is a 48 year old male with history of heart failure, hypertension who presents to the ED for evaluation.  Patient with hypertension but otherwise unremarkable vitals.  Patient states some chronic shortness of breath.  Has been without medications for the last 5 to 7 days as he has recently moved back to the area from [place].  Patient does not have any primary care in the area.  Does not have any current chest pain, new shortness of breath.  Has some chronic leg swelling.  There is no signs of respiratory distress on exam.  Clear breath sounds.  Normal vitals.  100% on room air.  Overall patient mostly here for medication refill.  EKG showed no signs of ischemic process.  Patient does not have any chest pain.  Doubt cardiac process.  Chest x-ray showed no major signs of volume overload, no pneumonia, no pneumothorax.  No significant leukocytosis, kidney injury, electrolyte abnormality.  Conferred medications with the patient and sent them out on a month prescription for each medication.  Given information to follow-up with the wellness center.  Given return precautions and discharged from ED in good condition.  This chart  was dictated using voice recognition software.  Despite best efforts to proofread,  errors can occur which can change the documentation meaning.   Final Clinical Impressions(s) / ED Diagnoses   Final diagnoses:  Chronic congestive heart failure, unspecified heart failure type [facility])    ED Discharge Orders         Ordered    amLODipine (NORVASC) 10 MG tablet  Daily     05/04/18 2005    aspirin 81 MG chewable tablet  Daily     05/04/18 2005    atorvastatin (LIPITOR) 20 MG tablet  Daily     05/04/18 2005    carvedilol (COREG) 25 MG tablet  2 times daily with meals     05/04/18 2005    furosemide (LASIX) 40 MG tablet  Daily     05/04/18 2005    hydrALAZINE (APRESOLINE) 25 MG tablet  2 times daily     05/04/18 2005    metFORMIN (GLUCOPHAGE) 500 MG tablet  2 times daily with meals     05/04/18 2005    spironolactone (ALDACTONE) 25 MG tablet  Daily     05/04/18 2005           [place], DO 05/04/18 2013

## 2018-05-26 ENCOUNTER — Ambulatory Visit: Payer: BLUE CROSS/BLUE SHIELD | Attending: Critical Care Medicine | Admitting: Critical Care Medicine

## 2018-05-26 ENCOUNTER — Encounter: Payer: Self-pay | Admitting: Critical Care Medicine

## 2018-05-26 ENCOUNTER — Ambulatory Visit (HOSPITAL_COMMUNITY)
Admission: RE | Admit: 2018-05-26 | Discharge: 2018-05-26 | Disposition: A | Discharge status: Home / Self Care | Payer: BLUE CROSS/BLUE SHIELD | Source: Ambulatory Visit | Attending: Critical Care Medicine | Admitting: Critical Care Medicine

## 2018-05-26 VITALS — BP 147/92 | HR 66 | Temp 98.3°F | Resp 18 | Ht 71.0 in | Wt 281.0 lb

## 2018-05-26 DIAGNOSIS — M545 Low back pain, unspecified: Secondary | ICD-10-CM | POA: Insufficient documentation

## 2018-05-26 DIAGNOSIS — I5043 Acute on chronic combined systolic (congestive) and diastolic (congestive) heart failure: Secondary | ICD-10-CM

## 2018-05-26 DIAGNOSIS — I2510 Atherosclerotic heart disease of native coronary artery without angina pectoris: Secondary | ICD-10-CM

## 2018-05-26 DIAGNOSIS — M79652 Pain in left thigh: Secondary | ICD-10-CM

## 2018-05-26 DIAGNOSIS — G8929 Other chronic pain: Secondary | ICD-10-CM | POA: Diagnosis not present

## 2018-05-26 DIAGNOSIS — E119 Type 2 diabetes mellitus without complications: Secondary | ICD-10-CM

## 2018-05-26 DIAGNOSIS — Z87898 Personal history of other specified conditions: Secondary | ICD-10-CM

## 2018-05-26 DIAGNOSIS — R1032 Left lower quadrant pain: Secondary | ICD-10-CM | POA: Diagnosis not present

## 2018-05-26 DIAGNOSIS — I10 Essential (primary) hypertension: Secondary | ICD-10-CM

## 2018-05-26 DIAGNOSIS — Z114 Encounter for screening for human immunodeficiency virus [HIV]: Secondary | ICD-10-CM

## 2018-05-26 DIAGNOSIS — I517 Cardiomegaly: Secondary | ICD-10-CM | POA: Diagnosis not present

## 2018-05-26 LAB — POCT GLYCOSYLATED HEMOGLOBIN (HGB A1C): Hemoglobin A1C: 6.3 % — AB (ref 4.0–5.6)

## 2018-05-26 LAB — POCT ABI - SCREENING FOR PILOT NO CHARGE
Immediate ABI left: 1.27
Immediate ABI right: 1.28

## 2018-05-26 MED ORDER — ATORVASTATIN CALCIUM 20 MG PO TABS: 20.0000 mg | ORAL_TABLET | Freq: Every day | ORAL | 4 refills | Status: DC

## 2018-05-26 MED ORDER — ASPIRIN 81 MG PO CHEW: 81.0000 mg | CHEWABLE_TABLET | Freq: Every day | ORAL | 4 refills | Status: AC

## 2018-05-26 MED ORDER — ISOSORBIDE MONONITRATE ER 30 MG PO TB24: 30.0000 mg | ORAL_TABLET | Freq: Every day | ORAL | 6 refills | Status: DC

## 2018-05-26 MED ORDER — TETANUS-DIPHTH-ACELL PERTUSSIS 5-2.5-18.5 LF-MCG/0.5 IM SUSP: 0.5000 mL | Freq: Once | INTRAMUSCULAR | 0 refills | Status: AC

## 2018-05-26 MED ORDER — SPIRONOLACTONE 25 MG PO TABS: 25.0000 mg | ORAL_TABLET | Freq: Every day | ORAL | 0 refills | Status: DC

## 2018-05-26 MED ORDER — SPIRONOLACTONE 25 MG PO TABS: 25.0000 mg | ORAL_TABLET | Freq: Every day | ORAL | 4 refills | Status: DC

## 2018-05-26 MED ORDER — FUROSEMIDE 40 MG PO TABS: 40.0000 mg | ORAL_TABLET | Freq: Every day | ORAL | 4 refills | Status: DC

## 2018-05-26 MED ORDER — METFORMIN HCL 500 MG PO TABS: 500.0000 mg | ORAL_TABLET | Freq: Two times a day (BID) | ORAL | 1 refills | Status: DC

## 2018-05-26 MED ORDER — CARVEDILOL 25 MG PO TABS: 25.0000 mg | ORAL_TABLET | Freq: Two times a day (BID) | ORAL | 4 refills | Status: DC

## 2018-05-26 MED ORDER — AMLODIPINE BESYLATE 10 MG PO TABS: 10.0000 mg | ORAL_TABLET | Freq: Every day | ORAL | 6 refills | Status: DC

## 2018-05-26 MED ORDER — HYDRALAZINE HCL 25 MG PO TABS: 25.0000 mg | ORAL_TABLET | Freq: Two times a day (BID) | ORAL | 4 refills | Status: DC

## 2018-05-26 MED FILL — ATORVASTATIN 20 MG TABLET: 20 | 30 days supply | Qty: 30 | Fill #0

## 2018-05-26 MED FILL — ISOSORBIDE MN ER 30 MG TAB: 30 | 30 days supply | Qty: 30 | Fill #0

## 2018-05-26 MED FILL — hydrALAZINE HCL 25 MG TABS: 25 | 30 days supply | Qty: 60 | Fill #0

## 2018-05-26 MED FILL — SPIRONOLACTONE 25 MG TABLET: 25 | 30 days supply | Qty: 30 | Fill #0

## 2018-05-26 MED FILL — metFORMIN HCL 500 MG TABS: 500 | 30 days supply | Qty: 60 | Fill #0

## 2018-05-26 MED FILL — FUROSEMIDE 40 MG TAB: 40 | 30 days supply | Qty: 30 | Fill #0

## 2018-05-26 MED FILL — AMLODIPINE BESYLATE 10 MG T: 10 | 30 days supply | Qty: 30 | Fill #0

## 2018-05-26 MED FILL — CARVEDILOL 25 MG TABLET: 25 | 30 days supply | Qty: 60 | Fill #0

## 2018-05-26 NOTE — Progress Notes (Signed)
Subjective:    Patient ID: Dustin Nicholson, male    DOB: 1970/08/11, 48 y.o.   MRN: [id number]  Care gaps HIV, Flu and Tdap  Saw a PCP in [facility] and was a while  No ED visits in 2019    See ED Jan13  Dx CHF 10+ yrs.  D/t HTN.  Prediabetes dx.  + AMI Heart Cath ?[place] or [place].     Has seen Dr Viann Fish in the past.   Since ED visit: still retaining fluid.  If walks short distance: is sl dyspneic.   Thigh into groin area is burning, worse with walking   Diet:  Eats once a day   Works the overnight shift: Merchandiser, retail at [employer]: skips   Lunch: sleeping    Dinner:  [employer]ice fried chicken/steak potatoe,   At work ;Financial planner sandwich    Dustin Nicholson is a 48 year old male with history of heart failure, hypertension who presents to the ED for evaluation.  Patient with hypertension but otherwise unremarkable vitals.  Patient states some chronic shortness of breath.  Has been without medications for the last 5 to 7 days as he has recently moved back to the area from [place].  Patient does not have any primary care in the area.  Does not have any current chest pain, new shortness of breath.  Has some chronic leg swelling.  There is no signs of respiratory distress on exam.  Clear breath sounds.  Normal vitals.  100% on room air.  Overall patient mostly here for medication refill.  EKG showed no signs of ischemic process.  Patient does not have any chest pain.  Doubt cardiac process.  Chest x-ray showed no major signs of volume overload, no pneumonia, no pneumothorax.  No significant leukocytosis, kidney injury, electrolyte abnormality.    Congestive Heart Failure  Presents for follow-up visit. Associated symptoms include claudication, edema, muscle weakness, shortness of breath and unexpected weight change. Pertinent negatives include no abdominal pain, chest pain, chest pressure, fatigue, near-syncope, nocturia, orthopnea, palpitations or paroxysmal nocturnal dyspnea. The symptoms  have been worsening. Compliance with total regimen is 51-75%. Compliance problems: one med not sent by ED : imdur.  Compliance with diet is 26-50%. Compliance with exercise is 0-25%. Compliance with medications is 51-75%.   Wt Readings from Last 3 Encounters:  05/26/18 281 lb (127.5 kg)  05/04/18 275 lb (124.7 kg)  02/24/17 256 lb (116.1 kg)     Review of Systems  Constitutional: Positive for unexpected weight change. Negative for fatigue.  Respiratory: Positive for shortness of breath.   Cardiovascular: Positive for claudication. Negative for chest pain, palpitations and near-syncope.  Gastrointestinal: Negative for abdominal pain.  Genitourinary: Negative for nocturia.  Musculoskeletal: Positive for muscle weakness.       Objective:   Physical Exam Vitals:   05/26/18 1422  BP: (!) 147/92  Pulse: 66  Resp: 18  Temp: 98.3 F (36.8 C)  TempSrc: Oral  SpO2: 97%  Weight: 281 lb (127.5 kg)  Height: 5\' 11"  (1.803 m)    Gen: Pleasant, obese, in no distress,  normal affect  ENT: No lesions,  mouth clear,  oropharynx clear, no postnasal drip  Neck: 1+ JVD, no TMG, no carotid bruits  Lungs: No use of accessory muscles, no dullness to percussion, clear without rales or rhonchi  Cardiovascular: RRR, heart sounds normal, no murmur or gallops, no peripheral edema  Abdomen: soft and NT, no HSM,  BS normal  Musculoskeletal: No deformities,  no cyanosis or clubbing  Mild edema in both lower extremities Significant tenderness and swelling in the left thigh anterior laterally  Neuro: alert, non focal  Skin: Warm, no lesions or rashes  HgbA1C 6.3  BMP Latest Ref Rng & Units 05/04/2018 02/24/2017  Glucose 70 - 99 mg/dL 211(H) 99  BUN 6 - 20 mg/dL 13 11  Creatinine 4.17 - 1.24 mg/dL 4.08 1.44  Sodium 818 - 145 mmol/L 139 139  Potassium 3.5 - 5.1 mmol/L 3.3(L) 2.8(L)  Chloride 98 - 111 mmol/L 108 110  CO2 22 - 32 mmol/L 24 20(L)  Calcium 8.9 - 10.3 mg/dL 5.6(D) 7.3(L)   Lab  Results  Component Value Date   WBC 4.9 05/04/2018   HGB 14.2 05/04/2018   HCT 45.8 05/04/2018   MCV 88.8 05/04/2018   PLT 198 05/04/2018          Assessment & Plan:  I personally reviewed all images and lab data in the [facility] system as well as any outside material available during this office visit and agree with the  radiology impressions.   Acute on chronic combined systolic and diastolic congestive heart failure (HCC) History of acute on chronic systolic and diastolic heart failure by history exacerbated by hypertension poorly controlled and history of coronary disease with previous myocardial infarction  The myocardial infarction occurred in the [facility] health system and that is where his heart catheterization occurred  The patient had been seeing Dr. Viann Fish here locally and we will try to obtain records from Dr. York Spaniel office  Plan will be to renew all of his cardiac medications at this visit We will obtain an echocardiogram for evaluation   Coronary artery disease involving native heart without angina pectoris We need to obtain outside records to see what his coronary artery anatomy has been in the past  Type 2 diabetes mellitus without complication, without long-term current use of insulin (HCC) Hemoglobin A1c is good at 6.3 on current program of metformin  I reviewed a diabetic diet with this patient  Metformin was renewed  Pain of left thigh There is chronic left thigh pain which I am concerned about the possibility of deep venous thrombosis.  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 and I am worried about blood clots going into the leg however the ABIs are normal today  Plan will be to obtain venous Doppler ultrasound of the left extremity and depending upon that may need to evaluate neurologically  Encounter for screening for HIV The patient is due an HIV study and this will be obtained  HTN (hypertension) Essential  hypertension which has been exacerbating the patient's heart failure and note blood pressure poorly controlled today at 147/90  Hopefully the addition of Imdur will control blood pressure better but we may need to make other adjustments as well in the patient's medication profile  Chronic left-sided low back pain without sciatica Imaging study of the lower lumbar area will be obtained   Fady was seen today for hospitalization follow-up.  Diagnoses and all orders for this visit:  Acute on chronic combined systolic and diastolic congestive heart failure (HCC) -     ECHOCARDIOGRAM COMPLETE; Future -     DG Chest 2 View; Future -     Basic metabolic panel; Future  History of prediabetes -     HgB A1c  Type 2 diabetes mellitus without complication, without long-term current use of insulin (HCC) -     Lipid Panel -  Basic metabolic panel; Future  Pain of left thigh -     VAS [place] LOWER EXTREMITY VENOUS (DVT); Future  Chronic left-sided low back pain without sciatica -     DG Lumbar Spine Complete; Future  Encounter for screening for HIV -     HIV antibody (with reflex)  Coronary artery disease involving native heart without angina pectoris, unspecified vessel or lesion type -     ECHOCARDIOGRAM COMPLETE; Future  Essential hypertension  Other orders -     Discontinue: spironolactone (ALDACTONE) 25 MG tablet; Take 1 tablet (25 mg total) by mouth daily for 30 days. -     metFORMIN (GLUCOPHAGE) 500 MG tablet; Take 1 tablet (500 mg total) by mouth 2 (two) times daily with a meal. -     isosorbide mononitrate (IMDUR) 30 MG 24 hr tablet; Take 1 tablet (30 mg total) by mouth daily. -     hydrALAZINE (APRESOLINE) 25 MG tablet; Take 1 tablet (25 mg total) by mouth 2 (two) times daily for 30 days. -     Discontinue: furosemide (LASIX) 40 MG tablet; Take 1 tablet (40 mg total) by mouth daily for 30 days. -     carvedilol (COREG) 25 MG tablet; Take 1 tablet (25 mg total) by mouth 2 (two)  times daily with a meal for 30 days. -     atorvastatin (LIPITOR) 20 MG tablet; Take 1 tablet (20 mg total) by mouth daily for 30 days. -     aspirin 81 MG chewable tablet; Chew 1 tablet (81 mg total) by mouth daily for 30 days. -     amLODipine (NORVASC) 10 MG tablet; Take 1 tablet (10 mg total) by mouth daily for 30 days. -     spironolactone (ALDACTONE) 25 MG tablet; Take 1 tablet (25 mg total) by mouth daily for 30 days. -     furosemide (LASIX) 40 MG tablet; Take 1 tablet (40 mg total) by mouth daily for 30 days.   Note patient needs an flu vaccine and Tdap and these were administered at this visit   Note we will obtain chest x-ray, and lipid panel

## 2018-05-26 NOTE — Assessment & Plan Note (Signed)
Essential hypertension which has been exacerbating the patient's heart failure and note blood pressure poorly controlled today at 147/90  Hopefully the addition of Imdur will control blood pressure better but we may need to make other adjustments as well in the patient's medication profile

## 2018-05-26 NOTE — Assessment & Plan Note (Signed)
We need to obtain outside records to see what his coronary artery anatomy has been in the past

## 2018-05-26 NOTE — Assessment & Plan Note (Signed)
There is chronic left thigh pain which I am concerned about the possibility of deep venous thrombosis.  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

## 2018-05-26 NOTE — Assessment & Plan Note (Signed)
History of acute on chronic systolic and diastolic heart failure by history exacerbated by hypertension poorly controlled and history of coronary disease with previous myocardial infarction  The myocardial infarction occurred in the [facility] health system and that is where his heart catheterization occurred  The patient had been seeing Dr. Viann Fish here locally and we will try to obtain records from Dr. York Spaniel office  Plan will be to renew all of his cardiac medications at this visit We will obtain an echocardiogram for evaluation

## 2018-05-26 NOTE — Assessment & Plan Note (Signed)
Imaging study of the lower lumbar area will be obtained

## 2018-05-26 NOTE — Patient Instructions (Addendum)
A tetanus vaccine and flu vaccine was given today  A chest x-ray, lumbar back x-ray, heart ultrasound was ordered  All of your medications were refilled to our pharmacy including the Imdur  Stop the gabapentin  An HIV and lipid panel were ordered from the pharmacy  We will have you sign a record release for Dr. York Spaniel office records  Please stop by and get your financial paperwork completed  A referral to our clinical pharmacist will be made to go over your diabetes and hypertension care  A primary care referral in our clinic will be established  Please follow the hypertension diet as below   Managing Your Hypertension Hypertension is commonly called high blood pressure. This is when the force of your blood pressing against the walls of your arteries is too strong. Arteries are blood vessels that carry blood from your heart throughout your body. Hypertension forces the heart to work harder to pump blood, and may cause the arteries to become narrow or stiff. Having untreated or uncontrolled hypertension can cause heart attack, stroke, kidney disease, and other problems. What are blood pressure readings? A blood pressure reading consists of a higher number over a lower number. Ideally, your blood pressure should be below 120/80. The first ("top") number is called the systolic pressure. It is a measure of the pressure in your arteries as your heart beats. The second ("bottom") number is called the diastolic pressure. It is a measure of the pressure in your arteries as the heart relaxes. What does my blood pressure reading mean? Blood pressure is classified into four stages. Based on your blood pressure reading, your health care provider may use the following stages to determine what type of treatment you need, if any. Systolic pressure and diastolic pressure are measured in a unit called mm Hg. Normal  Systolic pressure: below 120.  Diastolic pressure: below 80. Elevated  Systolic  pressure: 120-129.  Diastolic pressure: below 80. Hypertension stage 1  Systolic pressure: 130-139.  Diastolic pressure: 80-89. Hypertension stage 2  Systolic pressure: 140 or above.  Diastolic pressure: 90 or above. What health risks are associated with hypertension? Managing your hypertension is an important responsibility. Uncontrolled hypertension can lead to:  A heart attack.  A stroke.  A weakened blood vessel (aneurysm).  Heart failure.  Kidney damage.  Eye damage.  Metabolic syndrome.  Memory and concentration problems. What changes can I make to manage my hypertension? Hypertension can be managed by making lifestyle changes and possibly by taking medicines. Your health care provider will help you make a plan to bring your blood pressure within a normal range. Eating and drinking   Eat a diet that is high in fiber and potassium, and low in salt (sodium), added sugar, and fat. An example eating plan is called the DASH (Dietary Approaches to Stop Hypertension) diet. To eat this way: ? Eat plenty of fresh fruits and vegetables. Try to fill half of your plate at each meal with fruits and vegetables. ? Eat whole grains, such as whole wheat pasta, brown rice, or whole grain bread. Fill about one quarter of your plate with whole grains. ? Eat low-fat diary products. ? Avoid fatty cuts of meat, processed or cured meats, and poultry with skin. Fill about one quarter of your plate with lean proteins such as fish, chicken without skin, beans, eggs, and tofu. ? Avoid premade and processed foods. These tend to be higher in sodium, added sugar, and fat.  Reduce your daily sodium intake.  Most people with hypertension should eat less than 1,500 mg of sodium a day.  Limit alcohol intake to no more than 1 drink a day for nonpregnant women and 2 drinks a day for men. One drink equals 12 oz of beer, 5 oz of wine, or 1 oz of hard liquor. Lifestyle  Work with your health care  provider to maintain a healthy body weight, or to lose weight. Ask what an ideal weight is for you.  Get at least 30 minutes of exercise that causes your heart to beat faster (aerobic exercise) most days of the week. Activities may include walking, swimming, or biking.  Include exercise to strengthen your muscles (resistance exercise), such as weight lifting, as part of your weekly exercise routine. Try to do these types of exercises for 30 minutes at least 3 days a week.  Do not use any products that contain nicotine or tobacco, such as cigarettes and e-cigarettes. If you need help quitting, ask your health care provider.  Control any long-term (chronic) conditions you have, such as high cholesterol or diabetes. Monitoring  Monitor your blood pressure at home as told by your health care provider. Your personal target blood pressure may vary depending on your medical conditions, your age, and other factors.  Have your blood pressure checked regularly, as often as told by your health care provider. Working with your health care provider  Review all the medicines you take with your health care provider because there may be side effects or interactions.  Talk with your health care provider about your diet, exercise habits, and other lifestyle factors that may be contributing to hypertension.  Visit your health care provider regularly. Your health care provider can help you create and adjust your plan for managing hypertension. Will I need medicine to control my blood pressure? Your health care provider may prescribe medicine if lifestyle changes are not enough to get your blood pressure under control, and if:  Your systolic blood pressure is 130 or higher.  Your diastolic blood pressure is 80 or higher. Take medicines only as told by your health care provider. Follow the directions carefully. Blood pressure medicines must be taken as prescribed. The medicine does not work as well when you  skip doses. Skipping doses also puts you at risk for problems. Contact a health care provider if:  You think you are having a reaction to medicines you have taken.  You have repeated (recurrent) headaches.  You feel dizzy.  You have swelling in your ankles.  You have trouble with your vision. Get help right away if:  You develop a severe headache or confusion.  You have unusual weakness or numbness, or you feel faint.  You have severe pain in your chest or abdomen.  You vomit repeatedly.  You have trouble breathing. Summary  Hypertension is when the force of blood pumping through your arteries is too strong. If this condition is not controlled, it may put you at risk for serious complications.  Your personal target blood pressure may vary depending on your medical conditions, your age, and other factors. For most people, a normal blood pressure is less than 120/80.  Hypertension is managed by lifestyle changes, medicines, or both. Lifestyle changes include weight loss, eating a healthy, low-sodium diet, exercising more, and limiting alcohol. This information is not intended to replace advice given to you by your health care provider. Make sure you discuss any questions you have with your health care provider. Document Released: 01/01/2012 Document Revised:  03/06/2016 Document Reviewed: 03/06/2016 Elsevier Interactive Patient Education  [employer].

## 2018-05-26 NOTE — Assessment & Plan Note (Signed)
Hemoglobin A1c is good at 6.3 on current program of metformin  I reviewed a diabetic diet with this patient  Metformin was renewed

## 2018-05-26 NOTE — Assessment & Plan Note (Signed)
The patient is due an HIV study and this will be obtained

## 2018-05-27 ENCOUNTER — Telehealth: Payer: Self-pay | Admitting: General Practice

## 2018-05-27 LAB — LIPID PANEL
Chol/HDL Ratio: 4.3 ratio (ref 0.0–5.0)
Cholesterol, Total: 169 mg/dL (ref 100–199)
HDL: 39 mg/dL — AB (ref >39)
LDL Calculated: 112 mg/dL — ABNORMAL HIGH (ref 0–99)
Triglycerides: 88 mg/dL (ref 0–149)
VLDL CHOLESTEROL CAL: 18 mg/dL (ref 5–40)

## 2018-05-27 LAB — HIV ANTIBODY (ROUTINE TESTING W REFLEX): HIV Screen 4th Generation wRfx: NONREACTIVE

## 2018-06-02 ENCOUNTER — Telehealth: Payer: Self-pay | Admitting: Critical Care Medicine

## 2018-06-02 ENCOUNTER — Ambulatory Visit (HOSPITAL_COMMUNITY)
Admission: RE | Admit: 2018-06-02 | Discharge: 2018-06-02 | Disposition: A | Discharge status: Home / Self Care | Payer: BLUE CROSS/BLUE SHIELD | Source: Ambulatory Visit | Attending: Critical Care Medicine | Admitting: Critical Care Medicine

## 2018-06-02 ENCOUNTER — Ambulatory Visit (HOSPITAL_BASED_OUTPATIENT_CLINIC_OR_DEPARTMENT_OTHER)
Admission: RE | Admit: 2018-06-02 | Discharge: 2018-06-02 | Disposition: A | Discharge status: Home / Self Care | Payer: BLUE CROSS/BLUE SHIELD | Source: Ambulatory Visit | Attending: Critical Care Medicine | Admitting: Critical Care Medicine

## 2018-06-02 ENCOUNTER — Telehealth: Payer: Self-pay | Admitting: General Practice

## 2018-06-02 DIAGNOSIS — I071 Rheumatic tricuspid insufficiency: Secondary | ICD-10-CM | POA: Insufficient documentation

## 2018-06-02 DIAGNOSIS — I5043 Acute on chronic combined systolic (congestive) and diastolic (congestive) heart failure: Secondary | ICD-10-CM | POA: Diagnosis not present

## 2018-06-02 DIAGNOSIS — I2510 Atherosclerotic heart disease of native coronary artery without angina pectoris: Secondary | ICD-10-CM

## 2018-06-02 DIAGNOSIS — I219 Acute myocardial infarction, unspecified: Secondary | ICD-10-CM | POA: Insufficient documentation

## 2018-06-02 DIAGNOSIS — E119 Type 2 diabetes mellitus without complications: Secondary | ICD-10-CM | POA: Insufficient documentation

## 2018-06-02 DIAGNOSIS — I110 Hypertensive heart disease with heart failure: Secondary | ICD-10-CM | POA: Insufficient documentation

## 2018-06-02 DIAGNOSIS — M79652 Pain in left thigh: Secondary | ICD-10-CM | POA: Diagnosis not present

## 2018-06-02 NOTE — Progress Notes (Signed)
Left lower extremity venous duplex has been completed. Preliminary results can be found in [facility] Proc through chart review.  Results were given to Dr. Delford Field.  06/02/18 2:13 PM Olen Cordial RVT

## 2018-06-02 NOTE — Telephone Encounter (Signed)
Dustin Nicholson, I spoke to the pt and he knows vascular u/s result and echo cardiogram results.  He needs records from Dr Karleen Hampshire Tilley's office and also needs referral to cardiology for his heart failure.   I put referral in today with this encounter.

## 2018-06-02 NOTE — Telephone Encounter (Signed)
Prior auth # [id number] Valid- 2/11-3/11

## 2018-06-02 NOTE — Telephone Encounter (Signed)
Lupita Leash with scheduling called in regards to pre-cert for the echocardiogram that has to be done this afternoon. Please follow up.

## 2018-06-02 NOTE — Progress Notes (Signed)
  Echocardiogram 2D Echocardiogram has been performed.  Dustin Nicholson 06/02/2018, 2:50 PM

## 2018-06-03 ENCOUNTER — Telehealth: Payer: Self-pay | Admitting: *Deleted

## 2018-06-03 NOTE — Telephone Encounter (Signed)
Requested medical records from Dr. Donnie Aho office. Informed that request was sent on 05/26/2018

## 2018-06-04 NOTE — Telephone Encounter (Signed)
Called and spoke with the office they had not faxed over the papers they had mailed them to the patient. They will be faxing them today.

## 2018-06-05 NOTE — Telephone Encounter (Signed)
Request paperwork received on 06/05/2018 afternoon.   Duplicated note.

## 2018-06-08 ENCOUNTER — Telehealth: Payer: Self-pay | Admitting: Cardiology

## 2018-06-08 NOTE — Telephone Encounter (Signed)
Call came to our office today, though I do not see where we have seen the pt. Looks like Dr. Delford Field may have already reviewed test results with the pt on 06/03/18. Looks like there may some records or paperwork in process. I will send to Guy Franco RN as this seems to be the last person in contact with the pt. I do not see where cardiology called the pt as we do not see the pt.

## 2018-06-08 NOTE — Telephone Encounter (Signed)
Pt called the office saying he received a message to call us. He did not have a name of an individual that called him. He was only told to call the office. He was unsure why he was told to call.    Based on what I could find:  He had an Echo done on 02/11.  He also had a request for paperwork to be sent to him, but he didn't think that was the reason they were returning his call.    Pt just wants to follow up wherever necessary

## 2018-06-09 NOTE — Addendum Note (Signed)
Addended by: Margaretmary Lombard on: 06/09/2018 03:25 PM   Modules accepted: Orders

## 2018-06-09 NOTE — Telephone Encounter (Signed)
Faxed paperwork/ patient records from Dr. Donnie Aho office to [facility] [address] location at [phone]. Please call patient to schedule an appointment. He has CHF and needs to establish care with new Cardiologist since his provider retired per Dr. Delford Field.

## 2018-06-09 NOTE — Telephone Encounter (Signed)
Faxed paperwork to medical records department: [facility] location at [phone] (fax) .

## 2018-06-10 ENCOUNTER — Telehealth: Payer: Self-pay

## 2018-06-10 NOTE — Telephone Encounter (Signed)
NOTES ON FILE 

## 2018-06-25 ENCOUNTER — Ambulatory Visit: Payer: Self-pay | Admitting: Family Medicine

## 2018-07-23 ENCOUNTER — Other Ambulatory Visit: Payer: Self-pay

## 2018-07-23 ENCOUNTER — Ambulatory Visit: Payer: Self-pay | Attending: Family Medicine | Admitting: Family Medicine

## 2018-07-23 DIAGNOSIS — Z5321 Procedure and treatment not carried out due to patient leaving prior to being seen by health care provider: Secondary | ICD-10-CM

## 2018-11-16 MED FILL — ISOSORBIDE MN ER 30 MG TAB: 30 | 30 days supply | Qty: 30 | Fill #1

## 2018-11-16 MED FILL — ?CARVEDILOL 25 MG TABLET: 25 | 30 days supply | Qty: 60 | Fill #1

## 2018-11-16 MED FILL — ?ATORVASTATIN 20 MG TABLET: 20 | 30 days supply | Qty: 30 | Fill #1

## 2018-11-16 MED FILL — ?SPIRONOLACTON 25MG TABLET: 25 | 30 days supply | Qty: 30 | Fill #1

## 2018-11-16 MED FILL — ?AMLODIPINE BESYLATE 10 MG: 10 | 30 days supply | Qty: 30 | Fill #1

## 2018-11-16 MED FILL — ?METFORMIN HCL 500MG TABLET: 500 | 30 days supply | Qty: 60 | Fill #1

## 2018-11-16 MED FILL — FUROSEMIDE 40 MG TAB: 40 | 30 days supply | Qty: 30 | Fill #1

## 2018-11-16 MED FILL — hydrALAZINE HCL 25 MG TABS: 25 | 30 days supply | Qty: 60 | Fill #1

## 2018-11-25 ENCOUNTER — Encounter: Payer: Self-pay | Admitting: Family Medicine

## 2018-11-25 ENCOUNTER — Encounter (HOSPITAL_COMMUNITY): Payer: Self-pay | Admitting: Emergency Medicine

## 2018-11-25 ENCOUNTER — Other Ambulatory Visit: Payer: Self-pay

## 2018-11-25 ENCOUNTER — Emergency Department (HOSPITAL_COMMUNITY)
Admission: EM | Admit: 2018-11-25 | Discharge: 2018-11-25 | Discharge status: Against Medical Advice | Payer: Self-pay | Attending: Emergency Medicine | Admitting: Emergency Medicine

## 2018-11-25 ENCOUNTER — Emergency Department (HOSPITAL_COMMUNITY): Payer: Self-pay

## 2018-11-25 ENCOUNTER — Ambulatory Visit: Payer: Self-pay | Attending: Family Medicine | Admitting: Family Medicine

## 2018-11-25 VITALS — BP 121/82 | HR 72 | Temp 98.4°F | Ht 71.0 in | Wt 296.6 lb

## 2018-11-25 DIAGNOSIS — R9431 Abnormal electrocardiogram [ECG] [EKG]: Secondary | ICD-10-CM

## 2018-11-25 DIAGNOSIS — R7303 Prediabetes: Secondary | ICD-10-CM

## 2018-11-25 DIAGNOSIS — I509 Heart failure, unspecified: Secondary | ICD-10-CM

## 2018-11-25 DIAGNOSIS — Z5321 Procedure and treatment not carried out due to patient leaving prior to being seen by health care provider: Secondary | ICD-10-CM | POA: Insufficient documentation

## 2018-11-25 DIAGNOSIS — R079 Chest pain, unspecified: Secondary | ICD-10-CM

## 2018-11-25 DIAGNOSIS — I2510 Atherosclerotic heart disease of native coronary artery without angina pectoris: Secondary | ICD-10-CM

## 2018-11-25 LAB — CBC
HCT: 46.8 % (ref 39.0–52.0)
Hemoglobin: 14.9 g/dL (ref 13.0–17.0)
MCH: 27.6 pg (ref 26.0–34.0)
MCHC: 31.8 g/dL (ref 30.0–36.0)
MCV: 86.8 fL (ref 80.0–100.0)
Platelets: 219 10*3/uL (ref 150–400)
RBC: 5.39 MIL/uL (ref 4.22–5.81)
RDW: 14.1 % (ref 11.5–15.5)
WBC: 6.6 10*3/uL (ref 4.0–10.5)
nRBC: 0 % (ref 0.0–0.2)

## 2018-11-25 LAB — BASIC METABOLIC PANEL
Anion gap: 10 (ref 5–15)
BUN: 13 mg/dL (ref 6–20)
CO2: 24 mmol/L (ref 22–32)
Calcium: 8.8 mg/dL — ABNORMAL LOW (ref 8.9–10.3)
Chloride: 104 mmol/L (ref 98–111)
Creatinine, Ser: 1.32 mg/dL — ABNORMAL HIGH (ref 0.61–1.24)
GFR calc Af Amer: >60 mL/min (ref >60)
GFR calc non Af Amer: >60 mL/min (ref >60)
Glucose, Bld: 110 mg/dL — ABNORMAL HIGH (ref 70–99)
Potassium: 3.8 mmol/L (ref 3.5–5.1)
Sodium: 138 mmol/L (ref 135–145)

## 2018-11-25 LAB — TROPONIN I (HIGH SENSITIVITY): Troponin I (High Sensitivity): 14 ng/L (ref <18)

## 2018-11-25 MED ORDER — SODIUM CHLORIDE 0.9% FLUSH: 3.0000 mL | Freq: Once | INTRAVENOUS | Status: DC

## 2018-11-25 NOTE — ED Triage Notes (Signed)
Pt reports crushing central CP since 0900, has not taken anything for it. States he had an appt. with his PCP today who sent him here for an abnormal EKG.

## 2018-11-25 NOTE — Progress Notes (Signed)
Established Patient Office Visit  Subjective:  Patient ID: Dustin Nicholson, male    DOB: 11/18/1970  Age: 48 y.o. MRN: [id number]  CC:  Chief Complaint  Patient presents with   Hospitalization Follow-up   Chest Pain   Congestive Heart Failure   Diabetes   Referral    HPI Dustin Nicholson, 48 year old African-American male, who was last seen in this office on 05/26/2018 in follow-up of emergency department visit on 05/04/2018 at which time he was seen for increased shortness of breath and history of hypertension and chronic heart failure.  At today's visit, patient reports that he was awakened from sleep at approximately 9 AM today due to substernal chest pressure which was about a 8-12 on a 0-to-10 scale.  Patient states that after he sat up he felt nauseous as if he would throw up.  He denies any radiation of pain to the neck, jaw, back or either arm/shoulder.  He does report past medical history of MI x2.  Patient and wife/partner reports that patient had chest pain for 4 to 6 hours.  Patient reports that he did not take any medication for his chest pain.  He reports that his cardiologist has retired and he has had no recent cardiology follow-up.  He will need referral to a new cardiologist.  He reports that his earlier chest discomfort has resolved.        Patient's wife reports that over the past 1-1/2 to 2 weeks, patient has had increased leg swelling especially in the left leg/thigh area.  Swelling will improve with elevation of the legs but then returns as soon as patient is not elevating his legs.  Patient has had some mild increase shortness of breath but denies any increase in having to have additional pillows to prop himself up with at night while sleeping/no increased shortness of breath with lying down.  He denies any need to have air circulating while sleep or no sensation of needing extra air such as opening a window.  Wife however states that patient always keeps the bedroom very cold at  night.  He reports compliance with his current medications for treatment of hypertension/heart failure including amlodipine, hydralazine, Imdur and spironolactone and carvedilol as well as daily use of Lasix.  He reports compliance with metformin for treatment of prediabetes.  He denies any urinary frequency other than with use of fluid pill.  He does have some increased thirst which may be medication related.  Patient continues to take atorvastatin for hyperlipidemia.  He does not appear to be on aspirin therapy on review of medications.  He did not take any aspirin earlier today when having episode of chest pressure.   Past Medical History:  Diagnosis Date   CHF (congestive heart failure) (HCC)    Diabetes mellitus without complication (HCC)    Borderline   Hypertension    MI (myocardial infarction) (HCC) 2009/2011   Pt states he has had 2 MI's    History reviewed. No pertinent surgical history.  Family History  Problem Relation Age of Onset   Hypertension Mother    Hypertension Father     Social History   Socioeconomic History   Marital status: Significant Other    Spouse name: Not on file   Number of children: Not on file   Years of education: Not on file   Highest education level: Not on file  Occupational History   Not on file  Social Needs   Financial resource strain: Not on file  Food insecurity    Worry: Not on file    Inability: Not on file   Transportation needs    Medical: Not on file    Non-medical: Not on file  Tobacco Use   Smoking status: Never Smoker   Smokeless tobacco: Never Used  Substance and Sexual Activity   Alcohol use: Yes    Comment: occ   Drug use: No   Sexual activity: Yes  Lifestyle   Physical activity    Days per week: Not on file    Minutes per session: Not on file   Stress: Not on file  Relationships   Social connections    Talks on phone: Not on file    Gets together: Not on file    Attends religious  service: Not on file    Active member of club or organization: Not on file    Attends meetings of clubs or organizations: Not on file    Relationship status: Not on file   Intimate partner violence    Fear of current or ex partner: Not on file    Emotionally abused: Not on file    Physically abused: Not on file    Forced sexual activity: Not on file  Other Topics Concern   Not on file  Social History Narrative   Not on file    No facility-administered medications prior to visit.    Outpatient Medications Prior to Visit  Medication Sig Dispense Refill   amLODipine (NORVASC) 10 MG tablet Take 1 tablet (10 mg total) by mouth daily for 30 days. 30 tablet 6   atorvastatin (LIPITOR) 20 MG tablet Take 1 tablet (20 mg total) by mouth daily for 30 days. 30 tablet 4   carvedilol (COREG) 25 MG tablet Take 1 tablet (25 mg total) by mouth 2 (two) times daily with a meal for 30 days. 60 tablet 4   furosemide (LASIX) 40 MG tablet Take 1 tablet (40 mg total) by mouth daily for 30 days. 30 tablet 4   hydrALAZINE (APRESOLINE) 25 MG tablet Take 1 tablet (25 mg total) by mouth 2 (two) times daily for 30 days. 60 tablet 4   isosorbide mononitrate (IMDUR) 30 MG 24 hr tablet Take 1 tablet (30 mg total) by mouth daily. 30 tablet 6   metFORMIN (GLUCOPHAGE) 500 MG tablet Take 1 tablet (500 mg total) by mouth 2 (two) times daily with a meal. 60 tablet 1   spironolactone (ALDACTONE) 25 MG tablet Take 1 tablet (25 mg total) by mouth daily for 30 days. 30 tablet 4    Allergies  Allergen Reactions   Shellfish Allergy Anaphylaxis    ROS Review of Systems  Constitutional: Positive for fatigue. Negative for chills, diaphoresis and fever.  HENT: Negative for congestion, sore throat and trouble swallowing.   Respiratory: Positive for shortness of breath. Negative for cough.   Cardiovascular: Positive for chest pain and leg swelling. Negative for palpitations.  Gastrointestinal: Positive for nausea.  Negative for abdominal pain, diarrhea and vomiting.  Endocrine: Negative for polydipsia, polyphagia and polyuria.  Genitourinary: Negative for dysuria and frequency.  Musculoskeletal: Negative for arthralgias and back pain.  Neurological: Negative for dizziness and headaches.  Hematological: Negative for adenopathy. Does not bruise/bleed easily.      Objective:    Physical Exam  Constitutional: He is oriented to person, place, and time. He appears well-developed and well-nourished. No distress.  Obese male who appears slightly older than stated age sitting on exam table in no acute  distress.  Patient is accompanied by his wife  Neck: Normal range of motion. Neck supple. No JVD present.  Large neck size  Cardiovascular: Normal rate and regular rhythm.  Pulmonary/Chest: Effort normal and breath sounds normal. No respiratory distress. He has no rales.  Abdominal: Soft. There is no abdominal tenderness. There is no rebound and no guarding.  Truncal obesity  Musculoskeletal:        General: Edema present. No tenderness.     Comments: No CVA tenderness, patient with 1+ pitting edema from the ankles to below the knees bilaterally  Lymphadenopathy:    He has no cervical adenopathy.  Neurological: He is alert and oriented to person, place, and time.  Skin: Skin is warm and dry. He is not diaphoretic.  Psychiatric: He has a normal mood and affect. His behavior is normal.  Nursing note and vitals reviewed.   BP 121/82 (BP Location: Left Arm, Patient Position: Sitting, Cuff Size: Large)    Pulse 72    Temp 98.4 F (36.9 C) (Oral)    Ht 5\' 11"  (1.803 m)    Wt 296 lb 9.6 oz (134.5 kg)    SpO2 96%    BMI 41.37 kg/m  Wt Readings from Last 3 Encounters:  11/25/18 296 lb 9.6 oz (134.5 kg)  05/26/18 281 lb (127.5 kg)  05/04/18 275 lb (124.7 kg)     Health Maintenance Due  Topic Date Due   PNEUMOCOCCAL POLYSACCHARIDE VACCINE AGE 62-64 HIGH RISK  01/24/1972   FOOT EXAM  01/24/1980    OPHTHALMOLOGY EXAM  01/24/1980   URINE MICROALBUMIN  01/24/1980   INFLUENZA VACCINE  11/21/2018   HEMOGLOBIN A1C  11/24/2018      No results found for: TSH Lab Results  Component Value Date   WBC 4.9 05/04/2018   HGB 14.2 05/04/2018   HCT 45.8 05/04/2018   MCV 88.8 05/04/2018   PLT 198 05/04/2018   Lab Results  Component Value Date   NA 139 05/04/2018   K 3.3 (L) 05/04/2018   CO2 24 05/04/2018   GLUCOSE 107 (H) 05/04/2018   BUN 13 05/04/2018   CREATININE 1.23 05/04/2018   BILITOT 0.6 02/24/2017   ALKPHOS 63 02/24/2017   AST 10 (L) 02/24/2017   ALT 11 (L) 02/24/2017   PROT 5.9 (L) 02/24/2017   ALBUMIN 3.3 (L) 02/24/2017   CALCIUM 8.6 (L) 05/04/2018   ANIONGAP 7 05/04/2018   Lab Results  Component Value Date   CHOL 169 05/26/2018   Lab Results  Component Value Date   HDL 39 (L) 05/26/2018   Lab Results  Component Value Date   LDLCALC 112 (H) 05/26/2018   Lab Results  Component Value Date   TRIG 88 05/26/2018   Lab Results  Component Value Date   CHOLHDL 4.3 05/26/2018   Lab Results  Component Value Date   HGBA1C 6.3 (A) 05/26/2018      Assessment & Plan:  1. Chest pain, unspecified type;4. CAD Patient reports a past medical history of CAD with prior MI x2.  Patient reports being awakened from sleep around 9 AM secondary to substernal/mid chest pressure with associated nausea but denies diaphoresis or any radiation of pain.  Patient had EKG done in the office which showed changes suggestive of possible demand ischemia.  Patient has been advised to go to the emergency department for further evaluation. - EKG 12-Lead  2. Abnormal EKG Discussed with the patient that his EKG done in office in follow-up of chest pain showed  findings concerning for demand ischemia.  Patient advised to go to the emergency department for further evaluation.  Patient does have per his report a history of CAD but has not had recent cardiology follow-up as his cardiologist  retired.  He additionally has heart failure and has had recent increase in peripheral edema.  Discussed with patient that CHF exacerbation could also cause EKG changes suggestive of demand ischemia. - EKG 12-Lead  3. Acute on chronic congestive heart failure, unspecified heart failure type [facility]) Patient reports recent increase in shortness of breath and peripheral edema.  Patient with abnormal EKG and patient was advised to go to the emergency department for further evaluation.  Patient was seen as last patient of the day and discussed with patient/wife that he needs to have immediate blood work such as cardiac enzymes and imaging which cannot be done here in the office in a timely manner and likely could not be obtained until tomorrow which would not be acceptable as patient has had chest pain and has history of CHF and now with abnormal EKG.  Patient voiced understanding and agreed to go to the emergency department.  EMS transport declined as he is not having chest pain at this time and vital signs stable. - EKG 12-Lead  4. Pre-diabetes (possible well-controlled type 2 diabetes) Patient had hemoglobin A1c of 6.3 in February however I am unsure if he was already on metformin at that time and actually has type 2 diabetes.  Patient will need further follow-up after his ED visit.  An After Visit Summary was printed and given to the patient.  Follow-up: Return for follow-up after hospital eval; go to ED now.   Cain Saupe, MD

## 2018-11-25 NOTE — ED Notes (Signed)
When family member was asked to leave, the pt that he wasn't stat\ying if she couldn't wait with him and walked out the door

## 2019-01-01 ENCOUNTER — Other Ambulatory Visit: Payer: Self-pay | Admitting: Critical Care Medicine

## 2019-01-01 MED FILL — ?AMLODIPINE BESYLATE 10 MG: 10 | 30 days supply | Qty: 30 | Fill #2

## 2019-01-01 MED FILL — FUROSEMIDE 40 MG TAB: 40 | 30 days supply | Qty: 30 | Fill #2

## 2019-01-01 MED FILL — hydrALAZINE HCL 25 MG TABS: 25 | 30 days supply | Qty: 60 | Fill #2

## 2019-01-01 MED FILL — ?CARVEDILOL 25 MG TABLET: 25 | 30 days supply | Qty: 60 | Fill #2

## 2019-01-01 MED FILL — ISOSORBIDE MN ER 30 MG TAB: 30 | 30 days supply | Qty: 30 | Fill #2

## 2019-01-01 MED FILL — ?SPIRONOLACTONE 25 MG TABLE: 25 | 30 days supply | Qty: 30 | Fill #2

## 2019-01-01 MED FILL — ?METFORMIN HCL 500MG TABLET: 500 | 30 days supply | Qty: 60 | Fill #0

## 2019-01-01 MED FILL — ?ATORVASTATIN 20 MG TABLET: 20 | 30 days supply | Qty: 30 | Fill #2

## 2019-01-06 ENCOUNTER — Ambulatory Visit: Payer: Self-pay | Attending: Family Medicine | Admitting: Licensed Clinical Social Worker

## 2019-01-06 DIAGNOSIS — Z598 Other problems related to housing and economic circumstances: Secondary | ICD-10-CM

## 2019-01-06 DIAGNOSIS — Z599 Problem related to housing and economic circumstances, unspecified: Secondary | ICD-10-CM

## 2019-01-08 ENCOUNTER — Other Ambulatory Visit: Payer: Self-pay

## 2019-01-12 NOTE — Progress Notes (Signed)
Call placed to patient. LCSW introduced self and explained role at [facility].   Pt reports decline in health resulting in medical bills. He is interested in financial counseling noting that he has pending medicaid. LCSW informed pt of application process. Pt was encouraged to wait for the decision from Medicaid prior to applying for Financial Counseling. If he is denied, LCSW can complete referral to Legal Aid to assist with Medicaid appeal, if needed.   Pt was appreciative for the information and verbalized understanding. He will contact LCSW with any questions or concerns.

## 2019-01-14 ENCOUNTER — Ambulatory Visit: Payer: Self-pay | Admitting: Family Medicine

## 2019-02-23 MED FILL — ?METFORMIN HCL 500MG TABLET: 500 | 30 days supply | Qty: 60 | Fill #1

## 2019-02-23 MED FILL — ISOSORBIDE MN ER 30 MG TAB: 30 | 30 days supply | Qty: 30 | Fill #3

## 2019-02-23 MED FILL — ?ATORVASTATIN 20 MG TABLET: 20 | 30 days supply | Qty: 30 | Fill #3

## 2019-02-23 MED FILL — FUROSEMIDE 40 MG TAB: 40 | 30 days supply | Qty: 30 | Fill #3

## 2019-02-23 MED FILL — CARVEDILOL 25 MG TABLET: 25 | 30 days supply | Qty: 60 | Fill #3

## 2019-02-23 MED FILL — ?AMLODIPINE BESYLATE 10 MG: 10 | 30 days supply | Qty: 30 | Fill #3

## 2019-02-23 MED FILL — hydrALAZINE HCL 25 MG TABS: 25 | 30 days supply | Qty: 60 | Fill #3

## 2019-02-23 MED FILL — ?SPIRONOLACTONE 25 MG TABLE: 25 | 30 days supply | Qty: 30 | Fill #3

## 2019-04-08 MED FILL — ?FUROSEMIDE 40 MG TABLET: 40 | 30 days supply | Qty: 30 | Fill #4

## 2019-04-08 MED FILL — ?CARVEDILOL 25MG TABLE: 25 | 30 days supply | Qty: 60 | Fill #4

## 2019-04-08 MED FILL — ?METFORMIN HCL 500MG TABLET: 500 | 30 days supply | Qty: 60 | Fill #2

## 2019-04-08 MED FILL — ?ATORVASTATIN 20 MG TABLET: 20 | 30 days supply | Qty: 30 | Fill #4

## 2019-04-08 MED FILL — ?SPIRONOLACTONE 25 MG TABLE: 25 | 30 days supply | Qty: 30 | Fill #4

## 2019-04-08 MED FILL — ?AMLODIPINE BESYLATE 10 MG: 10 | 30 days supply | Qty: 30 | Fill #4

## 2019-04-08 MED FILL — hydrALAZINE HCL 25 MG TABS: 25 | 30 days supply | Qty: 60 | Fill #4

## 2019-04-08 MED FILL — ISOSORBIDE MN ER 30 MG TAB: 30 | 30 days supply | Qty: 30 | Fill #4

## 2019-05-06 ENCOUNTER — Other Ambulatory Visit: Payer: Self-pay

## 2019-05-06 ENCOUNTER — Ambulatory Visit: Payer: Self-pay | Attending: Family Medicine | Admitting: Family Medicine

## 2019-05-06 ENCOUNTER — Encounter: Payer: Self-pay | Admitting: Family Medicine

## 2019-05-06 VITALS — BP 130/77 | HR 96 | Temp 98.8°F | Ht 71.0 in | Wt 299.0 lb

## 2019-05-06 DIAGNOSIS — I10 Essential (primary) hypertension: Secondary | ICD-10-CM

## 2019-05-06 DIAGNOSIS — Z789 Other specified health status: Secondary | ICD-10-CM

## 2019-05-06 DIAGNOSIS — E119 Type 2 diabetes mellitus without complications: Secondary | ICD-10-CM

## 2019-05-06 DIAGNOSIS — I2510 Atherosclerotic heart disease of native coronary artery without angina pectoris: Secondary | ICD-10-CM

## 2019-05-06 DIAGNOSIS — Z87898 Personal history of other specified conditions: Secondary | ICD-10-CM

## 2019-05-06 DIAGNOSIS — R7303 Prediabetes: Secondary | ICD-10-CM

## 2019-05-06 DIAGNOSIS — I509 Heart failure, unspecified: Secondary | ICD-10-CM

## 2019-05-06 LAB — POCT GLYCOSYLATED HEMOGLOBIN (HGB A1C): Hemoglobin A1C: 7.8 % — AB (ref 4.0–5.6)

## 2019-05-06 LAB — GLUCOSE, POCT (MANUAL RESULT ENTRY): POC Glucose: 196 mg/dL — AB (ref 70–99)

## 2019-05-06 MED ORDER — ATORVASTATIN CALCIUM 20 MG PO TABS: 20.0000 mg | ORAL_TABLET | Freq: Every day | ORAL | 4 refills | Status: DC

## 2019-05-06 MED ORDER — AMLODIPINE BESYLATE 10 MG PO TABS: 10.0000 mg | ORAL_TABLET | Freq: Every day | ORAL | 6 refills | Status: AC

## 2019-05-06 MED ORDER — TRUE METRIX BLOOD GLUCOSE TEST VI STRP: ORAL_STRIP | 12 refills | Status: AC

## 2019-05-06 MED ORDER — HYDRALAZINE HCL 25 MG PO TABS: 25.0000 mg | ORAL_TABLET | Freq: Two times a day (BID) | ORAL | 4 refills | Status: DC

## 2019-05-06 MED ORDER — TRUEPLUS LANCETS 28G MISC: 6 refills | Status: AC

## 2019-05-06 MED ORDER — SPIRONOLACTONE 25 MG PO TABS: 25.0000 mg | ORAL_TABLET | Freq: Every day | ORAL | 4 refills | Status: DC

## 2019-05-06 MED ORDER — FUROSEMIDE 40 MG PO TABS: ORAL_TABLET | ORAL | 4 refills | Status: DC

## 2019-05-06 MED ORDER — CARVEDILOL 25 MG PO TABS: 25.0000 mg | ORAL_TABLET | Freq: Two times a day (BID) | ORAL | 4 refills | Status: DC

## 2019-05-06 MED ORDER — TRUE METRIX METER W/DEVICE KIT: PACK | 0 refills | Status: AC

## 2019-05-06 MED ORDER — ISOSORBIDE MONONITRATE ER 30 MG PO TB24: 30.0000 mg | ORAL_TABLET | Freq: Every day | ORAL | 6 refills | Status: AC

## 2019-05-06 MED ORDER — METFORMIN HCL 500 MG PO TABS: 500.0000 mg | ORAL_TABLET | Freq: Two times a day (BID) | ORAL | 2 refills | Status: DC

## 2019-05-06 MED FILL — ?ATORVASTATIN 20 MG TABLET: 20 | 30 days supply | Qty: 30 | Fill #0

## 2019-05-06 MED FILL — ?FUROSEMIDE 40 MG TABLET: 40 | 35 days supply | Qty: 60 | Fill #0

## 2019-05-06 MED FILL — ?METFORMIN HCL 500MG TABLET: 500 | 30 days supply | Qty: 60 | Fill #0

## 2019-05-06 MED FILL — ?CARVEDILOL 25MG TABLE: 25 | 30 days supply | Qty: 60 | Fill #0

## 2019-05-06 MED FILL — ?SPIRONOLACTONE 25 MG TABLE: 25 | 30 days supply | Qty: 30 | Fill #0

## 2019-05-06 MED FILL — !TRUE METRIX BLOOD GLUCOSE: 1 days supply | Qty: 1 | Fill #0

## 2019-05-06 MED FILL — ISOSORBIDE MN ER 30 MG TAB: 30 | 30 days supply | Qty: 30 | Fill #0

## 2019-05-06 MED FILL — TRUEplus LANCETS 28G MISC: 30 days supply | Qty: 100 | Fill #0

## 2019-05-06 MED FILL — ?AMLODIPINE BESYLATE 10 MG: 10 | 30 days supply | Qty: 30 | Fill #0

## 2019-05-06 MED FILL — TRUE METRIX GLUCOSE TEST ST: 30 days supply | Qty: 100 | Fill #0

## 2019-05-06 MED FILL — hydrALAZINE HCL 25 MG TABS: 25 | 30 days supply | Qty: 60 | Fill #0

## 2019-05-06 NOTE — Patient Instructions (Signed)
Diabetes Basics  Diabetes (diabetes mellitus) is a long-term (chronic) disease. It occurs when the body does not properly use sugar (glucose) that is released from food after you eat. Diabetes may be caused by one or both of these problems:  Your pancreas does not make enough of a hormone called insulin.  Your body does not react in a normal way to insulin that it makes. Insulin lets sugars (glucose) go into cells in your body. This gives you energy. If you have diabetes, sugars cannot get into cells. This causes high blood sugar (hyperglycemia). Follow these instructions at home: How is diabetes treated? You may need to take insulin or other diabetes medicines daily to keep your blood sugar in balance. Take your diabetes medicines every day as told by your doctor. List your diabetes medicines here: Diabetes medicines  Name of medicine: ______________________________ ? Amount (dose): _______________ Time (a.m./p.m.): _______________ Notes: ___________________________________  Name of medicine: ______________________________ ? Amount (dose): _______________ Time (a.m./p.m.): _______________ Notes: ___________________________________  Name of medicine: ______________________________ ? Amount (dose): _______________ Time (a.m./p.m.): _______________ Notes: ___________________________________ If you use insulin, you will learn how to give yourself insulin by injection. You may need to adjust the amount based on the food that you eat. List the types of insulin you use here: Insulin  Insulin type: ______________________________ ? Amount (dose): _______________ Time (a.m./p.m.): _______________ Notes: ___________________________________  Insulin type: ______________________________ ? Amount (dose): _______________ Time (a.m./p.m.): _______________ Notes: ___________________________________  Insulin type: ______________________________ ? Amount (dose): _______________ Time (a.m./p.m.):  _______________ Notes: ___________________________________  Insulin type: ______________________________ ? Amount (dose): _______________ Time (a.m./p.m.): _______________ Notes: ___________________________________  Insulin type: ______________________________ ? Amount (dose): _______________ Time (a.m./p.m.): _______________ Notes: ___________________________________ How do I manage my blood sugar?  Check your blood sugar levels using a blood glucose monitor as directed by your doctor. Your doctor will set treatment goals for you. Generally, you should have these blood sugar levels:  Before meals (preprandial): 80-130 mg/dL (4.4-7.2 mmol/L).  After meals (postprandial): below 180 mg/dL (10 mmol/L).  A1c level: less than 7%. Write down the times that you will check your blood sugar levels: Blood sugar checks  Time: _______________ Notes: ___________________________________  Time: _______________ Notes: ___________________________________  Time: _______________ Notes: ___________________________________  Time: _______________ Notes: ___________________________________  Time: _______________ Notes: ___________________________________  Time: _______________ Notes: ___________________________________  What do I need to know about low blood sugar? Low blood sugar is called hypoglycemia. This is when blood sugar is at or below 70 mg/dL (3.9 mmol/L). Symptoms may include:  Feeling: ? Hungry. ? Worried or nervous (anxious). ? Sweaty and clammy. ? Confused. ? Dizzy. ? Sleepy. ? Sick to your stomach (nauseous).  Having: ? A fast heartbeat. ? A headache. ? A change in your vision. ? Tingling or no feeling (numbness) around the mouth, lips, or tongue. ? Jerky movements that you cannot control (seizure).  Having trouble with: ? Moving (coordination). ? Sleeping. ? Passing out (fainting). ? Getting upset easily (irritability). Treating low blood sugar To treat low blood  sugar, eat or drink something sugary right away. If you can think clearly and swallow safely, follow the 15:15 rule:  Take 15 grams of a fast-acting carb (carbohydrate). Talk with your doctor about how much you should take.  Some fast-acting carbs are: ? Sugar tablets (glucose pills). Take 3-4 glucose pills. ? 6-8 pieces of hard candy. ? 4-6 oz (120-150 mL) of fruit juice. ? 4-6 oz (120-150 mL) of regular (not diet) soda. ? 1 Tbsp (15 mL) honey or sugar.    Check your blood sugar 15 minutes after you take the carb.  If your blood sugar is still at or below 70 mg/dL (3.9 mmol/L), take 15 grams of a carb again.  If your blood sugar does not go above 70 mg/dL (3.9 mmol/L) after 3 tries, get help right away.  After your blood sugar goes back to normal, eat a meal or a snack within 1 hour. Treating very low blood sugar If your blood sugar is at or below 54 mg/dL (3 mmol/L), you have very low blood sugar (severe hypoglycemia). This is an emergency. Do not wait to see if the symptoms will go away. Get medical help right away. Call your local emergency services (911 in the [place] Do not drive yourself to the hospital. Questions to ask your health care provider  Do I need to meet with a diabetes educator?  What equipment will I need to care for myself at home?  What diabetes medicines do I need? When should I take them?  How often do I need to check my blood sugar?  What number can I call if I have questions?  When is my next doctor's visit?  Where can I find a support group for people with diabetes? Where to find more information  American Diabetes Association: www.diabetes.org  American Association of Diabetes Educators: www.diabeteseducator.org/patient-resources Contact a doctor if:  Your blood sugar is at or above 240 mg/dL (11.9 mmol/L) for 2 days in a row.  You have been sick or have had a fever for 2 days or more, and you are not getting better.  You have any of these  problems for more than 6 hours: ? You cannot eat or drink. ? You feel sick to your stomach (nauseous). ? You throw up (vomit). ? You have watery poop (diarrhea). Get help right away if:  Your blood sugar is lower than 54 mg/dL (3 mmol/L).  You get confused.  You have trouble: ? Thinking clearly. ? Breathing. Summary  Diabetes (diabetes mellitus) is a long-term (chronic) disease. It occurs when the body does not properly use sugar (glucose) that is released from food after digestion.  Take insulin and diabetes medicines as told.  Check your blood sugar every day, as often as told.  Keep all follow-up visits as told by your doctor. This is important. This information is not intended to replace advice given to you by your health care provider. Make sure you discuss any questions you have with your health care provider. Document Revised: 12/30/2018 Document Reviewed: 07/11/2017 Elsevier Patient Education  2020 [employer]  Heart Failure Exacerbation  Heart failure is a condition in which the heart does not fill up with enough blood, and therefore does not pump enough blood and oxygen to the body. When this happens, parts of the body do not get the blood and oxygen they need to function properly. This can cause symptoms such as breathing problems, fatigue, swelling, and confusion. Heart failure exacerbation refers to heart failure symptoms that get worse. The symptoms may get worse suddenly or develop slowly over time. Heart failure exacerbation is a serious medical problem that should be treated right away. What are the causes? A heart failure exacerbation can be triggered by:  Not taking your heart failure medicines correctly.  Infections.  Eating an unhealthy diet or a diet that is high in salt (sodium).  Drinking too much fluid.  Drinking alcohol.  Taking illegal drugs, such as cocaine or methamphetamine.  Not exercising. Other causes include:  Other heart conditions  such as an irregular heartbeat (arrhythmia).  Anemia.  Other medical problems, such as kidney failure. Sometimes the cause of the exacerbation is not known. What are the signs or symptoms? When heart failure symptoms suddenly or slowly get worse, this may be a sign of heart failure exacerbation. Symptoms of heart failure include:  Breathing problems or shortness of breath.  Chronic coughing or wheezing.  Fatigue.  Nausea or lack of appetite.  Feeling light-headed.  Confusion or memory loss.  Increased heart rate or irregular heartbeat.  Buildup of fluid in the legs, ankles, feet, or abdomen.  Difficulty breathing when lying down. How is this diagnosed? This condition is diagnosed based on:  Your symptoms and medical history.  A physical exam. You may also have tests, including:  Electrocardiogram (ECG). This test measures the electrical activity of your heart.  Echocardiogram. This test uses sound waves to take a picture of your heart to see how well it works.  Blood tests.  Imaging tests, such as: ? Chest X-ray. ? MRI. ? Ultrasound.  Stress test. This test examines how well your heart functions when you exercise. Your heart is monitored while you exercise on a treadmill or exercise bike. If you cannot exercise, medicines may be used to increase your heartbeat in place of exercise.  Cardiac catheterization. During this test, a thin, flexible tube (catheter) is inserted into a blood vessel and threaded up to your heart. This test allows your health care provider to check the arteries that lead to your heart (coronary arteries).  Right heart catheterization. During this test, the pressure in your heart is measured. How is this treated? This condition may be treated by:  Adjusting your heart medicines.  Maintaining a healthy lifestyle. This includes: ? Eating a heart-healthy diet that is low in sodium. ? Not using any products that contain nicotine or tobacco,  such as cigarettes and e-cigarettes. ? Regular exercise. ? Monitoring your fluid intake. ? Monitoring your weight and reporting changes to your health care provider.  Treating sleep apnea, if you have this condition.  Surgery. This may include: ? Implanting a device that helps both sides of your heart contract at the same time (cardiac resynchronization therapy device). This can help with heart function and relieve heart failure symptoms. ? Implanting a device that can correct heart rhythm problems (implantable cardioverter defibrillator). ? Connecting a device to your heart to help it pump blood (ventricular assist device). ? Heart transplant. Follow these instructions at home: Medicines  Take over-the-counter and prescription medicines only as told by your health care provider.  Do not stop taking your medicines or change the amount you take. If you are having problems or side effects from your medicines, talk to your health care provider.  If you are having difficulty paying for your medicines, contact a social worker or your clinic. There are many programs to assist with medicine costs.  Talk to your health care provider before starting any new medicines or supplements.  Make sure your health care provider and pharmacist have a list of all the medicines you are taking. Eating and drinking   Avoid drinking alcohol.  Eat a heart-healthy diet as told by your health care provider. This includes: ? Plenty of fruits and vegetables. ? Lean proteins. ? Low-fat dairy. ? Whole grains. ? Foods that are low in sodium. Activity   Exercise regularly as told by your health care provider. Balance exercise with rest.  Ask your health care provider  what activities are safe for you. This includes sexual activity, exercise, and daily tasks at home or work. Lifestyle  Do not use any products that contain nicotine or tobacco, such as cigarettes and e-cigarettes. If you need help quitting,  ask your health care provider.  Maintain a healthy weight. Ask your health care provider what weight is healthy for you.  Consider joining a patient support group. This can help with emotional problems you may have, such as stress and anxiety. General instructions  Talk to your health care provider about flu and pneumonia vaccines.  Keep a list of medicines that you are taking. This may help in emergency situations.  Keep all follow-up visits as told by your health care provider. This is important. Contact a health care provider if:  You have questions about your medicines or you miss a dose.  You feel anxious, depressed, or stressed.  You have swelling in your feet, ankles, legs, or abdomen.  You have shortness of breath during activity or exercise.  You have a cough.  You have a fever.  You have trouble sleeping.  You gain 2-3 lb (1-1.4 kg) in 24 hours or 5 lb (2.3 kg) in a week. Get help right away if:  You have chest pain.  You have shortness of breath while resting.  You have severe fatigue.  You are confused.  You have severe dizziness.  You have a rapid or irregular heartbeat.  You have nausea or you vomit.  You have a cough that is worse at night or you cannot lie flat.  You have a cough that will not go away.  You have severe depression or sadness. Summary  When heart failure symptoms get worse, it is called heart failure exacerbation.  Common causes of this condition include taking medicines incorrectly, infections, and drinking alcohol.  This condition may be treated by adjusting medicines, maintaining a healthy lifestyle, or surgery.  Do not stop taking your medicines or change the amount you take. If you are having problems or side effects from your medicines, talk to your health care provider. This information is not intended to replace advice given to you by your health care provider. Make sure you discuss any questions you have with your  health care provider. Document Revised: 03/21/2017 Document Reviewed: 08/20/2016 Elsevier Patient Education  [place].

## 2019-05-06 NOTE — Progress Notes (Signed)
Pt. Is here for hypertension and prediabetes follow up.

## 2019-05-06 NOTE — Progress Notes (Signed)
Established Patient Office Visit  Subjective:  Patient ID: Dustin Nicholson, male    DOB: 22-Apr-1971  Age: 49 y.o. MRN: [id number]  CC:  Chief Complaint  Patient presents with  . Follow-up    HPI Dustin Nicholson, 49 year old male, who presents for chronic medical issues including hypertension, CHF, coronary artery disease and prediabetes.  He reports that he has had some recent increase in lower extremity swelling as well as some increased shortness of breath and decreased ability to lie flat when sleeping.  He reports the need for refills of medications.  He denies any recent issues with chest pain or palpitations.  No shortness of breath at rest and no cough.  He has had some recent increase in fatigue.  He sometimes feels as if he has increased thirst.  He feels that his urinary frequency is related to his use of Lasix.  Past Medical History:  Diagnosis Date  . CHF (congestive heart failure) ([place])   . 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 Hypertension   . MI (myocardial infarction) ([place]) 2009/2011   Pt states he has had 2 MI's    History reviewed. No pertinent surgical history.  Family History  Problem Relation Age of Onset  . Hypertension Mother   . Hypertension Father     Social History   Socioeconomic History  . Marital status: Significant Other    Spouse name: Not on file  . Number of children: Not on file  . Years of education: Not on file  . Highest education level: Not on file  Occupational History  . Not on file  Tobacco Use  . Smoking status: Never Smoker  . Smokeless tobacco: Never Used  Substance and Sexual Activity  . Alcohol use: Yes    Comment: occ  . Drug use: No  . Sexual activity: Yes  Other Topics Concern  . Not on file  Social History Narrative  . Not on file   Social Determinants of Health   Financial Resource Strain:   . Difficulty of Paying Living Expenses:   Food Insecurity:   . Worried About Charity fundraiser in  the Last Year:   . Arboriculturist in the Last Year:   Transportation Needs:   . Film/video editor (Medical):   Marland Kitchen Lack of Transportation (Non-Medical):   Physical Activity:   . Days of Exercise per Week:   . Minutes of Exercise per Session:   Stress:   . Feeling of Stress :   Social Connections:   . Frequency of Communication with Friends and Family:   . Frequency of Social Gatherings with Friends and Family:   . Attends Religious Services:   . Active Member of Clubs or Organizations:   . Attends Archivist Meetings:   Marland Kitchen Marital Status:   Intimate Partner Violence:   . Fear of Current or Ex-Partner:   . Emotionally Abused:   Marland Kitchen Physically Abused:   . Sexually Abused:     Outpatient Medications Prior to Visit  Medication Sig Dispense Refill  . isosorbide mononitrate (IMDUR) 30 MG 24 hr tablet Take 1 tablet (30 mg total) by mouth daily. 30 tablet 6  . amLODipine (NORVASC) 10 MG tablet Take 1 tablet (10 mg total) by mouth daily for 30 days. 30 tablet 6  . atorvastatin (LIPITOR) 20 MG tablet Take 1 tablet (20 mg total) by mouth daily for 30 days. 30 tablet 4  . carvedilol (COREG) 25  MG tablet Take 1 tablet (25 mg total) by mouth 2 (two) times daily with a meal for 30 days. 60 tablet 4  . furosemide (LASIX) 40 MG tablet Take 1 tablet (40 mg total) by mouth daily for 30 days. 30 tablet 4  . hydrALAZINE (APRESOLINE) 25 MG tablet Take 1 tablet (25 mg total) by mouth 2 (two) times daily for 30 days. 60 tablet 4  . metFORMIN (GLUCOPHAGE) 500 MG tablet Take 1 tablet (500 mg total) by mouth 2 (two) times daily with a meal. 60 tablet 2  . spironolactone (ALDACTONE) 25 MG tablet Take 1 tablet (25 mg total) by mouth daily for 30 days. 30 tablet 4   No facility-administered medications prior to visit.    Allergies  Allergen Reactions  . Shellfish Allergy Anaphylaxis    ROS Review of Systems  Constitutional: Positive for fatigue. Negative for chills and fever.  HENT:  Negative for sore throat and trouble swallowing.   Eyes: Negative for photophobia and visual disturbance.  Respiratory: Negative for cough and shortness of breath.   Cardiovascular: Positive for leg swelling. Negative for chest pain and palpitations.  Gastrointestinal: Negative for abdominal pain, blood in stool, constipation, diarrhea and nausea.  Endocrine: Positive for polydipsia and polyphagia. Negative for polyuria.  Genitourinary: Positive for frequency ( due to lasix use per patient). Negative for dysuria.  Musculoskeletal: Negative for arthralgias and back pain.  Skin: Negative for rash and wound.  Neurological: Negative for dizziness and headaches.  Hematological: Negative for adenopathy. Does not bruise/bleed easily.  Psychiatric/Behavioral: Positive for dysphoric mood. Negative for self-injury and suicidal ideas.      Objective:    Physical Exam  Constitutional: He is oriented to person, place, and time. He appears well-developed and well-nourished.  Well-nourished well-developed obese male in no acute distress wearing mask as per office COVID-19 protocol  Neck: No JVD present.  Cardiovascular: Normal rate and regular rhythm.  Pulmonary/Chest: Effort normal and breath sounds normal.  Abdominal: Soft. There is no abdominal tenderness. There is no rebound and no guarding.  Musculoskeletal:        General: Edema (Bilateral lower extremity edema with 1+ pitting) present. No tenderness.     Cervical back: Normal range of motion and neck supple.  Lymphadenopathy:    He has no cervical adenopathy.  Neurological: He is alert and oriented to person, place, and time.  Skin: Skin is warm and dry.  Psychiatric: He has a normal mood and affect. His behavior is normal.  Nursing note and vitals reviewed.   BP 130/77 (BP Location: Left Arm, Patient Position: Sitting, Cuff Size: Large)   Pulse 96   Temp 98.8 F (37.1 C) (Oral)   Ht 5' 11" (1.803 m)   Wt 299 lb (135.6 kg)   SpO2 95%    BMI 41.70 kg/m  Wt Readings from Last 3 Encounters:  07/28/19 290 lb 6.4 oz (131.7 kg)  05/06/19 299 lb (135.6 kg)  11/25/18 296 lb 9.6 oz (134.5 kg)     Health Maintenance Due  Topic Date Due  . FOOT EXAM  Never done  . OPHTHALMOLOGY EXAM  Never done     Lab Results  Component Value Date   TSH 2.930 07/28/2019   Lab Results  Component Value Date   WBC 6.6 11/25/2018   HGB 14.9 11/25/2018   HCT 46.8 11/25/2018   MCV 86.8 11/25/2018   PLT 219 11/25/2018   Lab Results  Component Value Date   NA 141 07/28/2019  K 4.0 07/28/2019   CO2 25 07/28/2019   GLUCOSE 149 (H) 07/28/2019   BUN 7 07/28/2019   CREATININE 1.01 07/28/2019   BILITOT 0.6 07/28/2019   ALKPHOS 83 07/28/2019   AST 13 07/28/2019   ALT 18 07/28/2019   PROT 6.4 07/28/2019   ALBUMIN 4.0 07/28/2019   CALCIUM 9.1 07/28/2019   ANIONGAP 10 11/25/2018   Lab Results  Component Value Date   CHOL 169 05/26/2018   Lab Results  Component Value Date   HDL 39 (L) 05/26/2018   Lab Results  Component Value Date   LDLCALC 112 (H) 05/26/2018   Lab Results  Component Value Date   TRIG 88 05/26/2018   Lab Results  Component Value Date   CHOLHDL 4.3 05/26/2018   Lab Results  Component Value Date   HGBA1C 8.2 (H) 07/28/2019      Assessment & Plan:  1. History of prediabetes Patient with prior hemoglobin A1c of 6.3 in February 2020 and will have repeat A1c at today's visit as well as glucose in follow-up of his history of prediabetes.  Patient's glucose at today's visit was elevated at 196 and hemoglobin A1c elevated at 7.8 consistent with diabetes. - Glucose (CBG) - HgB A1c  2. Essential hypertension Blood pressure is controlled at today's visit and at goal of 130/80 or less.  He is provided with refills of his current medications including isosorbide, furosemide, carvedilol, spironolactone, hydralazine and amlodipine - isosorbide mononitrate (IMDUR) 30 MG 24 hr tablet; Take 1 tablet (30 mg total)  by mouth daily.  Dispense: 30 tablet; Refill: 6 - furosemide (LASIX) 40 MG tablet; Take 2 pills twice per day for 3 days then 40 mg in am and 1/2 pill (20 mg ) in early evening  Dispense: 60 tablet; Refill: 4 - carvedilol (COREG) 25 MG tablet; Take 1 tablet (25 mg total) by mouth 2 (two) times daily with a meal.  Dispense: 60 tablet; Refill: 4 - amLODipine (NORVASC) 10 MG tablet; Take 1 tablet (10 mg total) by mouth daily.  Dispense: 30 tablet; Refill: 6  3. Type 2 diabetes mellitus without complication, without long-term current use of insulin (HCC) Patient with newly diagnosed type 2 diabetes as his hemoglobin A1c at today's visit is elevated at 7.8.  He will be started on Metformin 500 mg twice daily and low carbohydrate/no concentrated sweets diet discussed at today's visit.  He has also been provided with diabetic testing supplies.  He has been asked to follow-up in the next few weeks with the clinical pharmacist and bring his glucometer and blood sugar diary.  He will have BMP at today's visit. - Basic Metabolic Panel - Ambulatory referral to Cardiology - spironolactone (ALDACTONE) 25 MG tablet; Take 1 tablet (25 mg total) by mouth daily.  Dispense: 30 tablet; Refill: 4 - Blood Glucose Monitoring Suppl (TRUE METRIX METER) w/Device KIT; Use to check BS up to 3 times per day  Dispense: 1 kit; Refill: 0 - glucose blood (TRUE METRIX BLOOD GLUCOSE TEST) test strip; Use as instructed  Dispense: 100 each; Refill: 12 - TRUEplus Lancets 28G MISC; Use when checking blood sugars  Dispense: 100 each; Refill: 6 - Amb Referral to Clinical Pharmacist  4. Acute on chronic congestive heart failure, unspecified heart failure type [facility]) Patient has been asked to increase his dose of Lasix and take 80 mg twice a day for 3 days then he may resume 40 mg in a.m. and 20 mg in the early evening.  He will  have BMP at today's visit.  He is also being referred to cardiology for further evaluation and treatment.  If he has  any issues with chest pain, worsening of shortness of breath, worsening of peripheral edema or any concerns he should go to the emergency department for further evaluation.  He is also being provided with a refill of Coreg and spironolactone - Brain natriuretic peptide - Ambulatory referral to Cardiology - furosemide (LASIX) 40 MG tablet; Take 2 pills twice per day for 3 days then 40 mg in am and 1/2 pill (20 mg ) in early evening  Dispense: 60 tablet; Refill: 4 - carvedilol (COREG) 25 MG tablet; Take 1 tablet (25 mg total) by mouth 2 (two) times daily with a meal.  Dispense: 60 tablet; Refill: 4  5. Coronary artery disease involving native heart without angina pectoris, unspecified vessel or lesion type Cardiology referral placed for follow-up of CAD as well as congestive heart failure.  New prescription refill provided for patient's Imdur and atorvastatin and he is aware of the need to try and be compliant with lowering cholesterol, controlling blood pressure and continuing secondary prevention measures.  81 mg aspirin daily also advised. - isosorbide mononitrate (IMDUR) 30 MG 24 hr tablet; Take 1 tablet (30 mg total) by mouth daily.  Dispense: 30 tablet; Refill: 6 - atorvastatin (LIPITOR) 20 MG tablet; Take 1 tablet (20 mg total) by mouth daily.  Dispense: 30 tablet; Refill: 4  6. Need for follow-up by social worker Patient with depression related to chronic medical issues and he agrees to be contacted by social work for further evaluation and resources - Ambulatory referral to Social Work  Meds ordered this encounter  Medications  . spironolactone (ALDACTONE) 25 MG tablet    Sig: Take 1 tablet (25 mg total) by mouth daily.    Dispense:  30 tablet    Refill:  4  . DISCONTD: metFORMIN (GLUCOPHAGE) 500 MG tablet    Sig: Take 1 tablet (500 mg total) by mouth 2 (two) times daily with a meal.    Dispense:  60 tablet    Refill:  2  . isosorbide mononitrate (IMDUR) 30 MG 24 hr tablet    Sig:  Take 1 tablet (30 mg total) by mouth daily.    Dispense:  30 tablet    Refill:  6  . DISCONTD: hydrALAZINE (APRESOLINE) 25 MG tablet    Sig: Take 1 tablet (25 mg total) by mouth 2 (two) times daily.    Dispense:  60 tablet    Refill:  4  . furosemide (LASIX) 40 MG tablet    Sig: Take 2 pills twice per day for 3 days then 40 mg in am and 1/2 pill (20 mg ) in early evening    Dispense:  60 tablet    Refill:  4  . carvedilol (COREG) 25 MG tablet    Sig: Take 1 tablet (25 mg total) by mouth 2 (two) times daily with a meal.    Dispense:  60 tablet    Refill:  4  . atorvastatin (LIPITOR) 20 MG tablet    Sig: Take 1 tablet (20 mg total) by mouth daily.    Dispense:  30 tablet    Refill:  4  . amLODipine (NORVASC) 10 MG tablet    Sig: Take 1 tablet (10 mg total) by mouth daily.    Dispense:  30 tablet    Refill:  6  . Blood Glucose Monitoring Suppl (TRUE METRIX METER) w/Device  KIT    Sig: Use to check BS up to 3 times per day    Dispense:  1 kit    Refill:  0  . glucose blood (TRUE METRIX BLOOD GLUCOSE TEST) test strip    Sig: Use as instructed    Dispense:  100 each    Refill:  12  . TRUEplus Lancets 28G MISC    Sig: Use when checking blood sugars    Dispense:  100 each    Refill:  6    An After Visit Summary was printed and given to the patient.   Follow-up: Return in about 4 weeks (around 06/03/2019) for CHF- ED if worse otherwise 2 weeks with Lurena Joiner for DM.    Antony Blackbird, MD

## 2019-05-07 LAB — BASIC METABOLIC PANEL WITH GFR
BUN/Creatinine Ratio: 9 (ref 9–20)
BUN: 11 mg/dL (ref 6–24)
CO2: 25 mmol/L (ref 20–29)
Calcium: 9.2 mg/dL (ref 8.7–10.2)
Chloride: 101 mmol/L (ref 96–106)
Creatinine, Ser: 1.17 mg/dL (ref 0.76–1.27)
GFR calc Af Amer: 84 mL/min/1.73
GFR calc non Af Amer: 73 mL/min/1.73
Glucose: 177 mg/dL — ABNORMAL HIGH (ref 65–99)
Potassium: 4.1 mmol/L (ref 3.5–5.2)
Sodium: 139 mmol/L (ref 134–144)

## 2019-05-07 LAB — BRAIN NATRIURETIC PEPTIDE: BNP: 61.7 pg/mL (ref 0.0–100.0)

## 2019-05-10 ENCOUNTER — Telehealth: Payer: Self-pay | Admitting: *Deleted

## 2019-05-10 NOTE — Telephone Encounter (Signed)
Medical Assistant left message on patient's home and cell voicemail. Voicemail states to give a call back to [place] with [facility] at [phone]. !!Patient labs are normal and glucose level was discussed during the visit!!

## 2019-05-10 NOTE — Telephone Encounter (Signed)
-----   Message from Cain Saupe, MD sent at 05/07/2019 11:34 PM EST ----- BNP normal.  Glucose of 177 otherwise normal electrolytes.  Hemoglobin A1c is 7.8 consistent with diabetes was discussed with patient during his visit.

## 2019-05-10 NOTE — Telephone Encounter (Signed)
Patient verified DOB Patient is aware of BNP being normal and needing to adhere to DM regimen and follow up as planned.

## 2019-05-10 NOTE — Telephone Encounter (Signed)
-----   Message from Cammie Fulp, MD sent at 05/07/2019 11:34 PM EST ----- BNP normal.  Glucose of 177 otherwise normal electrolytes.  Hemoglobin A1c is 7.8 consistent with diabetes was discussed with patient during his visit. 

## 2019-05-12 NOTE — Progress Notes (Deleted)
Cardiology Office Consult Note    Date:  05/12/2019   ID:  Dustin Nicholson, DOB February 18, 1971, MRN [id number]  PCP:  Quintella Reichert, MD  Cardiologist:  Armanda Magic, MD   No chief complaint on file.   History of Present Illness:  Dustin Nicholson is a 49 y.o. male who is being seen today for the evaluation of CAD at the request of Fulp, Cammie, MD.  This is a 49yo morbidly obeses male with a hx of remote MIs (2009/2011), HTN, DM2 and chronic combined systolic/diastolic CHF.  His initial cath was in [place], [place] and then has been followed by Dr. Donnie Aho.  His last 2D echo was 06/02/2018 showing moderately reduced LVF with EF 35-40% with moderate LVH and G1DD and mildly dilated ascending aorta. He has been on amlodipine, Hydralazine, Carvedilol, spior, Imdur and Lasix.    He is here to establish cardiac care.  He is here today for followup and is doing well.  He denies any chest pain or pressure, SOB, DOE, PND, orthopnea, LE edema, dizziness, palpitations or syncope. He is compliant with his meds and is tolerating meds with no SE.      Past Medical History:  Diagnosis Date  . CHF (congestive heart failure) (HCC)   . 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 Hypertension   . MI (myocardial infarction) (HCC) 2009/2011   Pt states he has had 2 MI's    No past surgical history on file.  Current Medications: No outpatient medications have been marked as taking for the 05/13/19 encounter (Appointment) with Quintella Reichert, MD.    Allergies:   Shellfish allergy   Social History   Socioeconomic History  . Marital status: Significant Other    Spouse name: Not on file  . Number of children: Not on file  . Years of education: Not on file  . Highest education level: Not on file  Occupational History  . Not on file  Tobacco Use  . Smoking status: Never Smoker  . Smokeless tobacco: Never Used  Substance and Sexual Activity  . Alcohol use: Yes    Comment: occ  . Drug use:  No  . Sexual activity: Yes  Other Topics Concern  . Not on file  Social History Narrative  . Not on file   Social Determinants of Health   Financial Resource Strain:   . Difficulty of Paying Living Expenses: Not on file  Food Insecurity:   . Worried About Programme researcher, broadcasting/film/video in the Last Year: Not on file  . Ran Out of Food in the Last Year: Not on file  Transportation Needs:   . Lack of Transportation (Medical): Not on file  . Lack of Transportation (Non-Medical): Not on file  Physical Activity:   . Days of Exercise per Week: Not on file  . Minutes of Exercise per Session: Not on file  Stress:   . Feeling of Stress : Not on file  Social Connections:   . Frequency of Communication with Friends and Family: Not on file  . Frequency of Social Gatherings with Friends and Family: Not on file  . Attends Religious Services: Not on file  . Active Member of Clubs or Organizations: Not on file  . Attends Banker Meetings: Not on file  . Marital Status: Not on file     Family History:  The patient's family history includes Hypertension in his father and mother.   ROS:   Please see  the history of present illness.    ROS All other systems reviewed and are negative.  No flowsheet data found.     PHYSICAL EXAM:   VS:  There were no vitals taken for this visit.   GEN: Well nourished, well developed, in no acute distress  HEENT: normal  Neck: no JVD, carotid bruits, or masses Cardiac: RRR; no murmurs, rubs, or gallops,no edema.  Intact distal pulses bilaterally.  Respiratory:  clear to auscultation bilaterally, normal work of breathing GI: soft, nontender, nondistended, + BS MS: no deformity or atrophy  Skin: warm and dry, no rash Neuro:  Alert and Oriented x 3, Strength and sensation are intact Psych: euthymic mood, full affect  Wt Readings from Last 3 Encounters:  05/06/19 299 lb (135.6 kg)  11/25/18 296 lb 9.6 oz (134.5 kg)  05/26/18 281 lb (127.5 kg)       Studies/Labs Reviewed:   EKG:  EKG is ordered today.  The ekg ordered today demonstrates ***  Recent Labs: 11/25/2018: Hemoglobin 14.9; Platelets 219 05/06/2019: BNP 61.7; BUN 11; Creatinine, Ser 1.17; Potassium 4.1; Sodium 139   Lipid Panel    Component Value Date/Time   CHOL 169 05/26/2018 1543   TRIG 88 05/26/2018 1543   HDL 39 (L) 05/26/2018 1543   CHOLHDL 4.3 05/26/2018 1543   LDLCALC 112 (H) 05/26/2018 1543    Additional studies/ records that were reviewed today include:  Notes from Dr. Wynonia Lawman, Chapman Fitch and Joya Gaskins.  EKG, labs, 2D echo     ASSESSMENT:    1. Chronic combined systolic (congestive) and diastolic (congestive) heart failure ([place])   2. Coronary artery disease involving native coronary artery of native heart without angina pectoris   3. DM type 2, goal HbA1c < 7% (HCC)   4. Pure hypercholesterolemia   5. Essential hypertension      PLAN:  In order of problems listed above:  1. Chronic combined systolic/diastolic CHF -he appears euvolemic on exam -continue spiro 25mg  daily, Imdur 30mg  daily, Carvedilol 25mg  BID, Hydralazine 25mg  BID  2.  ASCAD -s/p remote MI x 2 (2009/2011) -caths done in [place] -continue Imdur 30mg  daily, Carvedilol 25mg  BID.  3.  DM2 -followed by PCP -HbA1C 7.8% -continue metformin 500mg  BID  4.  HLD -LDL goal < 70 -continue Atorvastatin 20mg  daily -check FLP and ALT  5.  HTN -BP controlled -continue Carvedilol, spiro, Hydralazine, amlodipine    Medication Adjustments/Labs and Tests Ordered: Current medicines are reviewed at length with the patient today.  Concerns regarding medicines are outlined above.  Medication changes, Labs and Tests ordered today are listed in the Patient Instructions below.  There are no Patient Instructions on file for this visit.   Signed, Fransico Him, MD  05/12/2019 8:22 PM    [facility] [address], [place], [place]  [address] Phone: [phone]; Fax:  [phone]

## 2019-05-13 ENCOUNTER — Ambulatory Visit: Payer: Self-pay | Admitting: Cardiology

## 2019-05-17 ENCOUNTER — Ambulatory Visit: Payer: Self-pay | Admitting: Pharmacist

## 2019-05-20 ENCOUNTER — Ambulatory Visit: Payer: Self-pay | Admitting: Pharmacist

## 2019-05-21 ENCOUNTER — Ambulatory Visit: Payer: Self-pay | Admitting: Pharmacist

## 2019-05-31 MED FILL — TRUE METRIX GLUCOSE TEST ST: 30 days supply | Qty: 100 | Fill #0

## 2019-05-31 MED FILL — ?ATORVASTATIN 20 MG TABLET: 20 | 30 days supply | Qty: 30 | Fill #0

## 2019-05-31 MED FILL — TRUEplus LANCETS 28G MISC: 30 days supply | Qty: 100 | Fill #0

## 2019-05-31 MED FILL — ?SPIRONOLACTONE 25 MG TABLE: 25 | 30 days supply | Qty: 30 | Fill #0

## 2019-05-31 MED FILL — ?METFORMIN HCL 500MG TABLET: 500 | 30 days supply | Qty: 60 | Fill #0

## 2019-05-31 MED FILL — AMLODIPINE BESYLATE 10 MG T: 10 | 30 days supply | Qty: 30 | Fill #0

## 2019-05-31 MED FILL — !TRUE METRIX BLOOD GLUCOSE: 1 days supply | Qty: 1 | Fill #0

## 2019-05-31 MED FILL — hydrALAZINE HCL 25 MG TABS: 25 | 30 days supply | Qty: 60 | Fill #0

## 2019-05-31 MED FILL — ISOSORBIDE MN ER 30 MG TAB: 30 | 30 days supply | Qty: 30 | Fill #0

## 2019-05-31 MED FILL — ?FUROSEMIDE 40 MG TABLET: 40 | 35 days supply | Qty: 60 | Fill #0

## 2019-05-31 MED FILL — ?CARVEDILOL 25MG TABLE: 25 | 30 days supply | Qty: 60 | Fill #0

## 2019-06-01 ENCOUNTER — Encounter: Payer: Self-pay | Admitting: General Practice

## 2019-06-10 ENCOUNTER — Ambulatory Visit: Payer: Self-pay | Attending: Family Medicine | Admitting: Family Medicine

## 2019-06-10 ENCOUNTER — Encounter: Payer: Self-pay | Admitting: Family Medicine

## 2019-06-10 ENCOUNTER — Other Ambulatory Visit: Payer: Self-pay

## 2019-06-10 DIAGNOSIS — I5042 Chronic combined systolic (congestive) and diastolic (congestive) heart failure: Secondary | ICD-10-CM

## 2019-06-10 DIAGNOSIS — I110 Hypertensive heart disease with heart failure: Secondary | ICD-10-CM

## 2019-06-10 DIAGNOSIS — E119 Type 2 diabetes mellitus without complications: Secondary | ICD-10-CM

## 2019-06-10 DIAGNOSIS — I10 Essential (primary) hypertension: Secondary | ICD-10-CM

## 2019-06-10 DIAGNOSIS — I2510 Atherosclerotic heart disease of native coronary artery without angina pectoris: Secondary | ICD-10-CM

## 2019-06-10 NOTE — Progress Notes (Signed)
Patient verified DOB Patient has not eaten today. Patient has not taken medication today. Patient denies pain at this time. BP 117/82 on yesterday on the right arm in the afternoon. [place] and has not checked his CBG in a week.

## 2019-06-10 NOTE — Progress Notes (Signed)
Virtual Visit via Telephone Note  I connected with Dustin Nicholson on 06/10/19 at  2:10 PM EST by telephone and verified that I am speaking with the correct person using two identifiers.   I discussed the limitations, risks, security and privacy concerns of performing an evaluation and management service by telephone and the availability of in person appointments. I also discussed with the patient that there may be a patient responsible charge related to this service. The patient expressed understanding and agreed to proceed.  Patient Location: Out of town residence Provider Location: [facility] Others participating in call: none   History of Present Illness:        49 year old male who is seen in follow-up of recent visit on May 06, 2019 in follow-up of chronic medical issues including hypertension, CHF, CAD with history of MI x2 and patient with prior history of prediabetes but at recent visit, hemoglobin A1c elevated at 7.8 consistent with type 2 diabetes.  He reports that he is tolerating the Metformin prescribed at his last visit to help with diabetes.  He has had no side effects such as loose stools or nausea associated with the medication.  He is currently out of town and has not checked his blood sugars for the past week but when he did check blood sugars previously they were good.  He reports that he can call back next week with the results.  No current urinary frequency or increased thirst.         He is taking his medications for CHF and hypertension.  He did increase Lasix as instructed at his last visit for a few days and now is back on 40 mg once daily.  Lower extremity edema is improved.  He denies any shortness of breath or cough.  He has had no chest pain or palpitations since his last visit.  He has monitored his blood pressure with a home cuff and blood pressure was 117/82 yesterday afternoon.  Overall he is feeling better.  He is tolerating his medications without issue.  He has  had no headaches or dizziness related to his blood pressure.   Past Medical History:  Diagnosis Date  . CHF (congestive heart failure) ([place])   . 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 Hypertension   . MI (myocardial infarction) ([place]) 2009/2011   Pt states he has had 2 MI's    History reviewed. No pertinent surgical history.  Family History  Problem Relation Age of Onset  . Hypertension Mother   . Hypertension Father     Social History   Tobacco Use  . Smoking status: Never Smoker  . Smokeless tobacco: Never Used  Substance Use Topics  . Alcohol use: Yes    Comment: occ  . Drug use: No     Allergies  Allergen Reactions  . Shellfish Allergy Anaphylaxis       Observations/Objective: No vital signs or physical exam conducted as visit was done via telephone Patient was able to speak comfortably in full sentences and had no indication of shortness of breath or discomfort.  No cough during conversation.  Assessment and Plan: 1. Essential hypertension Blood pressure stable and improved.  He reports compliance with daily medications.  Continue home monitoring of blood pressure with goal of 130/80 or less  2. Chronic combined systolic and diastolic heart failure (HCC) Currently stable, he has had a reduction in peripheral edema with restart of Lasix.  He was also advised to  contact his cardiologist, Dr. Armanda Magic regarding follow-up.  3. Type 2 diabetes mellitus without complication, without long-term current use of insulin (HCC) He reports that he has started use of Metformin and is monitoring his blood sugars.  No current increased thirst or urinary frequency other than with use of Lasix.  He will call next week with blood sugar readings.  Continue low carbohydrate diet, exercise as tolerated and follow-up in 2 months.  4. Coronary artery disease involving native coronary artery of native heart without angina pectoris He has resumed all of his  medications and has had no recent chest pain.  He is to continue current medications for secondary prevention of recurrent heart attack.  Continue follow-up with cardiology.  Follow Up Instructions: 2 months and/or previously scheduled follow-up; sooner if any issues or concerns    I discussed the assessment and treatment plan with the patient. The patient was provided an opportunity to ask questions and all were answered. The patient agreed with the plan and demonstrated an understanding of the instructions.   The patient was advised to call back or seek an in-person evaluation if the symptoms worsen or if the condition fails to improve as anticipated.  I provided 8 minutes of non-face-to-face time during this encounter.   Cain Saupe, MD

## 2019-06-15 ENCOUNTER — Telehealth: Payer: Self-pay | Admitting: Licensed Clinical Social Worker

## 2019-06-15 NOTE — Telephone Encounter (Signed)
Call placed to patient regarding [facility] referral. LCSW left message requesting a return call.  

## 2019-06-22 ENCOUNTER — Telehealth: Payer: Self-pay | Admitting: Licensed Clinical Social Worker

## 2019-06-22 NOTE — Telephone Encounter (Signed)
Call placed to patient, there was no answer and LCSW was not provided option to leave a voice message.

## 2019-07-05 ENCOUNTER — Other Ambulatory Visit: Payer: Self-pay | Admitting: Family Medicine

## 2019-07-05 DIAGNOSIS — I509 Heart failure, unspecified: Secondary | ICD-10-CM

## 2019-07-05 DIAGNOSIS — I10 Essential (primary) hypertension: Secondary | ICD-10-CM

## 2019-07-05 MED FILL — ?ATORVASTATIN 20 MG TABLET: 20 | 30 days supply | Qty: 30 | Fill #1

## 2019-07-05 MED FILL — ?METFORMIN HCL 500MG TABLET: 500 | 30 days supply | Qty: 60 | Fill #1

## 2019-07-05 MED FILL — ?FUROSEMIDE 40 MG TABLET: 40 | 35 days supply | Qty: 60 | Fill #1

## 2019-07-05 MED FILL — AMLODIPINE BESYLATE 10 MG T: 10 | 30 days supply | Qty: 30 | Fill #1

## 2019-07-05 MED FILL — hydrALAZINE HCL 25 MG TABS: 25 | 30 days supply | Qty: 60 | Fill #1

## 2019-07-05 MED FILL — ISOSORBIDE MN ER 30 MG TAB: 30 | 30 days supply | Qty: 30 | Fill #1

## 2019-07-05 MED FILL — ?SPIRONOLACTONE 25 MG TABLE: 25 | 30 days supply | Qty: 30 | Fill #1

## 2019-07-07 MED FILL — ?CARVEDILOL 25 MG TABLET: 25 | 30 days supply | Qty: 60 | Fill #1

## 2019-07-21 ENCOUNTER — Telehealth: Payer: Self-pay | Admitting: Licensed Clinical Social Worker

## 2019-07-21 NOTE — Telephone Encounter (Signed)
This MSW Intern placed call to patient regarding social work referral placed by Dr. Jillyn Hidden 05/06/19. Patient is scheduled to meet with Jenel Lucks, LCSW 07/26/19 for a behavioral health consult. (Referral notes: depression related to chronic illness, patient is open to counseling)

## 2019-07-26 ENCOUNTER — Other Ambulatory Visit: Payer: Self-pay

## 2019-07-26 ENCOUNTER — Telehealth: Payer: Self-pay | Admitting: Licensed Clinical Social Worker

## 2019-07-26 ENCOUNTER — Ambulatory Visit: Payer: Self-pay | Attending: Nurse Practitioner | Admitting: Licensed Clinical Social Worker

## 2019-07-26 NOTE — Telephone Encounter (Signed)
Call placed to patient regarding scheduled [facility] appointment. LCSW left message requesting a return call.  

## 2019-07-28 ENCOUNTER — Other Ambulatory Visit: Payer: Self-pay

## 2019-07-28 ENCOUNTER — Ambulatory Visit: Payer: Self-pay | Attending: Physician Assistant | Admitting: Physician Assistant

## 2019-07-28 VITALS — BP 138/84 | HR 82 | Temp 97.3°F | Resp 16 | Wt 290.4 lb

## 2019-07-28 DIAGNOSIS — G629 Polyneuropathy, unspecified: Secondary | ICD-10-CM

## 2019-07-28 DIAGNOSIS — E119 Type 2 diabetes mellitus without complications: Secondary | ICD-10-CM

## 2019-07-28 DIAGNOSIS — R519 Headache, unspecified: Secondary | ICD-10-CM

## 2019-07-28 DIAGNOSIS — R5383 Other fatigue: Secondary | ICD-10-CM

## 2019-07-28 DIAGNOSIS — I10 Essential (primary) hypertension: Secondary | ICD-10-CM

## 2019-07-28 DIAGNOSIS — I5042 Chronic combined systolic (congestive) and diastolic (congestive) heart failure: Secondary | ICD-10-CM

## 2019-07-28 LAB — GLUCOSE, POCT (MANUAL RESULT ENTRY): POC Glucose: 179 mg/dl — AB (ref 70–99)

## 2019-07-28 MED ORDER — METFORMIN HCL 500 MG PO TABS: 1000.0000 mg | ORAL_TABLET | Freq: Two times a day (BID) | ORAL | 3 refills | Status: AC

## 2019-07-28 MED ORDER — FLUTICASONE PROPIONATE 50 MCG/ACT NA SUSP: 2.0000 | Freq: Every day | NASAL | 6 refills | Status: AC

## 2019-07-28 MED ORDER — GABAPENTIN 300 MG PO CAPS: 300.0000 mg | ORAL_CAPSULE | Freq: Every day | ORAL | 3 refills | Status: AC

## 2019-07-28 MED ORDER — HYDRALAZINE HCL 25 MG PO TABS: 25.0000 mg | ORAL_TABLET | Freq: Three times a day (TID) | ORAL | 4 refills | Status: AC

## 2019-07-28 MED FILL — hydrALAZINE HCL 25 MG TABS: 25 | 30 days supply | Qty: 90 | Fill #0

## 2019-07-28 MED FILL — FLUTICASONE PROP 50 MCG SPR: 50 | 30 days supply | Qty: 16 | Fill #0

## 2019-07-28 MED FILL — GABAPENTIN 300 MG CAPSULE: 300 | 30 days supply | Qty: 30 | Fill #0

## 2019-07-28 MED FILL — ?METFORMIN HCL 500MG TABLET: 500 | 30 days supply | Qty: 120 | Fill #0

## 2019-07-28 NOTE — Patient Instructions (Signed)
Check blood pressure once daily and record.  Check blood sugars fasting and record

## 2019-07-28 NOTE — Progress Notes (Signed)
Patient ID: Dustin Nicholson, male   DOB: Sep 22, 1970, 50 y.o.   MRN: [id number]   Dustin Nicholson, is a 49 y.o. male  ESP:[id number]  QJF:[id number]  DOB - 1970-11-15  Subjective:  Chief Complaint and HPI: Dustin Nicholson is a 49 y.o. male here today C/o awakening with HA for about 1 month.  Also some Dizziness occurs in the mornings and throughout the day-lasts 5-7 mins when it occurs.  No vision changes.  HA is in the back of the head to the top of the head.  He has also been having some congestion and allergies.  HA comes and goes throughout the day.  BP has been running high.  His wife is here with him and it has been running 160-170/100-110 even on all his meds.  He has not seen cardiology in > 1year.  He denies CP.  He also has B leg pain and paresthesias at times.  He is compliant on all meds.  Also having some temperature intolerance.    Says blood sugars running 130-200.    ROS:   Constitutional:  No f/c, No night sweats, No unexplained weight loss. EENT:  No vision changes, No blurry vision, No hearing changes. No mouth, throat, or ear problems.  Respiratory: No cough, No SOB Cardiac: No CP, no palpitations GI:  No abd pain, No N/V/D. GU: No Urinary s/sx Musculoskeletal: No joint pain Neuro: + headache, + dizziness, no motor weakness.  Skin: No rash Endocrine:  No polydipsia. No polyuria.  Psych: Denies SI/HI  No problems updated.  ALLERGIES: Allergies  Allergen Reactions  . Shellfish Allergy Anaphylaxis    PAST MEDICAL HISTORY: Past Medical History:  Diagnosis Date  . CHF (congestive heart failure) ([place])   . 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 Hypertension   . MI (myocardial infarction) ([place]) 2009/2011   Pt states he has had 2 MI's    MEDICATIONS AT HOME: Prior to Admission medications   Medication Sig Start Date End Date Taking? Authorizing Provider  amLODipine (NORVASC) 10 MG tablet Take 1 tablet (10 mg total) by mouth daily. 05/06/19 06/10/19   Fulp, Cammie, MD  atorvastatin (LIPITOR) 20 MG tablet Take 1 tablet (20 mg total) by mouth daily. 05/06/19 06/10/19  Fulp, Cammie, MD  Blood Glucose Monitoring Suppl (TRUE METRIX METER) w/Device KIT Use to check BS up to 3 times per day 05/06/19   Fulp, Ander Gaster, MD  carvedilol (COREG) 25 MG tablet Take 1 tablet (25 mg total) by mouth 2 (two) times daily with a meal. 05/06/19 06/10/19  Fulp, Cammie, MD  fluticasone (FLONASE) 50 MCG/ACT nasal spray Place 2 sprays into both nostrils daily. 07/28/19   Argentina Donovan, PA-C  furosemide (LASIX) 40 MG tablet Take 2 pills twice per day for 3 days then 40 mg in am and 1/2 pill (20 mg ) in early evening 05/06/19   Fulp, Cammie, MD  gabapentin (NEURONTIN) 300 MG capsule Take 1 capsule (300 mg total) by mouth at bedtime. 07/28/19   Argentina Donovan, PA-C  glucose blood (TRUE METRIX BLOOD GLUCOSE TEST) test strip Use as instructed 05/06/19   Fulp, Cammie, MD  hydrALAZINE (APRESOLINE) 25 MG tablet Take 1 tablet (25 mg total) by mouth 3 (three) times daily. 07/28/19 08/27/19  Argentina Donovan, PA-C  isosorbide mononitrate (IMDUR) 30 MG 24 hr tablet Take 1 tablet (30 mg total) by mouth daily. 05/06/19   Fulp, Cammie, MD  metFORMIN (GLUCOPHAGE) 500 MG tablet Take 2 tablets (  1,000 mg total) by mouth 2 (two) times daily with a meal. 07/28/19 10/26/19  Argentina Donovan, PA-C  spironolactone (ALDACTONE) 25 MG tablet Take 1 tablet (25 mg total) by mouth daily. 05/06/19 06/10/19  Fulp, Ander Gaster, MD  TRUEplus Lancets 28G MISC Use when checking blood sugars 05/06/19   Fulp, Cammie, MD     Objective:  EXAM:   Vitals:   07/28/19 1530  BP: 138/84  Pulse: 82  Resp: 16  Temp: (!) 97.3 F (36.3 C)  SpO2: 97%  Weight: 290 lb 6.4 oz (131.7 kg)    General appearance : A&OX3. NAD. Non-toxic-appearing HEENT: Atraumatic and Normocephalic.  PERRLA. EOM intact.  TM full B;  L>R Neck: supple, no JVD. No cervical lymphadenopathy. No thyromegaly Chest/Lungs:  Breathing-non-labored, Good air  entry bilaterally, breath sounds normal without rales, rhonchi, or wheezing  CVS: S1 S2 regular, no murmurs, gallops, rubs  Extremities: Bilateral Lower Ext shows 1 edema=B, both legs are warm to touch with = pulse throughout Neurology:  CN II-XII grossly intact, Non focal.   Psych:  TP linear. J/I WNL. Normal speech. Appropriate eye contact and affect.  Skin:  No Rash  Data Review Lab Results  Component Value Date   HGBA1C 7.8 (A) 05/06/2019   HGBA1C 6.3 (A) 05/26/2018     Assessment & Plan   1. Type 2 diabetes mellitus without complication, without long-term current use of insulin (HCC) Uncontrolled.  Check blood sugars bid and record and bring to next visit.  Increase metformin dose - Glucose (CBG) - metFORMIN (GLUCOPHAGE) 500 MG tablet; Take 2 tablets (1,000 mg total) by mouth 2 (two) times daily with a meal.  Dispense: 120 tablet; Refill: 3 - Hemoglobin A1c - Comprehensive metabolic panel  2. Essential hypertension Not at goal based on home numbers and not for also having DM.  Will increase hydralazine from bid to tid - hydrALAZINE (APRESOLINE) 25 MG tablet; Take 1 tablet (25 mg total) by mouth 3 (three) times daily.  Dispense: 90 tablet; Refill: 4 - Comprehensive metabolic panel - Ambulatory referral to Cardiology Continue Imdur, carvediliol, amlodipine  3. Other fatigue - TSH - Vitamin D, 25-hydroxy  4. Neuropathy - Vitamin D, 25-hydroxy - gabapentin (NEURONTIN) 300 MG capsule; Take 1 capsule (300 mg total) by mouth at bedtime.  Dispense: 90 capsule; Refill: 3 - Vitamin B12 - Folate  5. Chronic combined systolic and diastolic heart failure (HCC) Continue spirinolactone and lasix along with other cardiac meds - Ambulatory referral to Cardiology  6. Nonintractable headache, unspecified chronicity pattern, unspecified headache type - fluticasone (FLONASE) 50 MCG/ACT nasal spray; Place 2 sprays into both nostrils daily.  Dispense: 16 g; Refill: 6  To ED if any  CP/SOB/worsening HA.  For now, ina ddition to these changes, he can take tylenol or advil for HA/leg pain.    Patient have been counseled extensively about nutrition and exercise  Return in about 4 weeks (around 08/25/2019) for Dr Chapman Fitch;  chronic conditions.  The patient was given clear instructions to go to ER or return to medical center if symptoms don't improve, worsen or new problems develop. The patient verbalized understanding. The patient was told to call to get lab results if they haven't heard anything in the next week.     Freeman Caldron, PA-C [facility], [place]   07/28/2019, 4:05 PM

## 2019-07-28 NOTE — Progress Notes (Signed)
Pt states he has been having severe headaches with dizzy spells  Pt states the pain is more so in b/l legs   Pt states he is always cold

## 2019-07-29 ENCOUNTER — Other Ambulatory Visit: Payer: Self-pay | Admitting: Physician Assistant

## 2019-07-29 DIAGNOSIS — E559 Vitamin D deficiency, unspecified: Secondary | ICD-10-CM

## 2019-07-29 LAB — COMPREHENSIVE METABOLIC PANEL
ALT: 18 IU/L (ref 0–44)
AST: 13 IU/L (ref 0–40)
Albumin/Globulin Ratio: 1.7 (ref 1.2–2.2)
Albumin: 4 g/dL (ref 4.0–5.0)
Alkaline Phosphatase: 83 IU/L (ref 39–117)
BUN/Creatinine Ratio: 7 — ABNORMAL LOW (ref 9–20)
BUN: 7 mg/dL (ref 6–24)
Bilirubin Total: 0.6 mg/dL (ref 0.0–1.2)
CO2: 25 mmol/L (ref 20–29)
Calcium: 9.1 mg/dL (ref 8.7–10.2)
Chloride: 105 mmol/L (ref 96–106)
Creatinine, Ser: 1.01 mg/dL (ref 0.76–1.27)
GFR calc Af Amer: 100 mL/min/{1.73_m2} (ref >59)
GFR calc non Af Amer: 87 mL/min/{1.73_m2} (ref >59)
Globulin, Total: 2.4 g/dL (ref 1.5–4.5)
Glucose: 149 mg/dL — ABNORMAL HIGH (ref 65–99)
Potassium: 4 mmol/L (ref 3.5–5.2)
Sodium: 141 mmol/L (ref 134–144)
Total Protein: 6.4 g/dL (ref 6.0–8.5)

## 2019-07-29 LAB — FOLATE: Folate: 6.3 ng/mL (ref >3.0)

## 2019-07-29 LAB — HEMOGLOBIN A1C
Est. average glucose Bld gHb Est-mCnc: 189 mg/dL
Hgb A1c MFr Bld: 8.2 % — ABNORMAL HIGH (ref 4.8–5.6)

## 2019-07-29 LAB — TSH: TSH: 2.93 u[IU]/mL (ref 0.450–4.500)

## 2019-07-29 LAB — VITAMIN B12: Vitamin B-12: 363 pg/mL (ref 232–1245)

## 2019-07-29 LAB — VITAMIN D 25 HYDROXY (VIT D DEFICIENCY, FRACTURES): Vit D, 25-Hydroxy: 5.5 ng/mL — ABNORMAL LOW (ref 30.0–100.0)

## 2019-07-29 MED ORDER — VITAMIN D (ERGOCALCIFEROL) 1.25 MG (50000 UNIT) PO CAPS: 50000.0000 [IU] | ORAL_CAPSULE | ORAL | 0 refills | Status: AC

## 2019-07-29 MED FILL — ?ERGOCALCIFEROL 50000 UNITC: 1.25 MG | 28 days supply | Qty: 4 | Fill #0

## 2019-07-30 MED FILL — ?CARVEDILOL 25 MG TABLET: 25 | 30 days supply | Qty: 60 | Fill #2

## 2019-07-30 MED FILL — ?ATORVASTATIN 20 MG TABLET: 20 | 30 days supply | Qty: 30 | Fill #2

## 2019-07-30 MED FILL — ISOSORBIDE MN ER 30 MG TAB: 30 | 30 days supply | Qty: 30 | Fill #2

## 2019-07-30 MED FILL — AMLODIPINE BESYLATE 10 MG T: 10 | 30 days supply | Qty: 30 | Fill #2

## 2019-07-30 MED FILL — ?SPIRONOLACTONE 25 MG TABLE: 25 | 30 days supply | Qty: 30 | Fill #2

## 2019-08-02 ENCOUNTER — Telehealth: Payer: Self-pay | Admitting: *Deleted

## 2019-08-02 NOTE — Telephone Encounter (Signed)
Patient called stating he is still having leg pain and would like to know what pain medication he can take. Per pt the tylenol is not working. Per pt he is taking 2 tabs of Extra Strength and its still not working. Per pt he is still swollen in the legs and it still hurts.

## 2019-08-03 NOTE — Telephone Encounter (Signed)
Patient can also take Ibuprofen. He can take up to 800 mg q8 h for the next couple of days if need be. I would recommend he also be seen in person for evaluation of his legs if he is having such significant pain.   Marcy Siren, D.O. Primary [facility] at [facility]  08/03/2019, 8:53 AM

## 2019-08-06 NOTE — Telephone Encounter (Signed)
Not able to reach patient or leave message. Phone keeps ringing and stating that call can not be completed at this time.

## 2019-08-09 NOTE — Telephone Encounter (Signed)
Not able to reach patient. Called number on file and phone keeps ring with no voicemail.

## 2019-08-13 ENCOUNTER — Encounter: Payer: Self-pay | Admitting: Internal Medicine

## 2019-08-13 ENCOUNTER — Other Ambulatory Visit: Payer: Self-pay

## 2019-08-13 ENCOUNTER — Ambulatory Visit (INDEPENDENT_AMBULATORY_CARE_PROVIDER_SITE_OTHER): Payer: Self-pay | Admitting: Internal Medicine

## 2019-08-13 VITALS — BP 146/88 | HR 74 | Ht 71.0 in | Wt 293.4 lb

## 2019-08-13 DIAGNOSIS — I5043 Acute on chronic combined systolic (congestive) and diastolic (congestive) heart failure: Secondary | ICD-10-CM

## 2019-08-13 DIAGNOSIS — E785 Hyperlipidemia, unspecified: Secondary | ICD-10-CM

## 2019-08-13 DIAGNOSIS — R0609 Other forms of dyspnea: Secondary | ICD-10-CM

## 2019-08-13 DIAGNOSIS — M79652 Pain in left thigh: Secondary | ICD-10-CM

## 2019-08-13 DIAGNOSIS — R600 Localized edema: Secondary | ICD-10-CM

## 2019-08-13 DIAGNOSIS — I513 Intracardiac thrombosis, not elsewhere classified: Secondary | ICD-10-CM

## 2019-08-13 DIAGNOSIS — R0600 Dyspnea, unspecified: Secondary | ICD-10-CM

## 2019-08-13 DIAGNOSIS — I10 Essential (primary) hypertension: Secondary | ICD-10-CM

## 2019-08-13 DIAGNOSIS — E119 Type 2 diabetes mellitus without complications: Secondary | ICD-10-CM

## 2019-08-13 DIAGNOSIS — I2510 Atherosclerotic heart disease of native coronary artery without angina pectoris: Secondary | ICD-10-CM

## 2019-08-13 MED ORDER — SPIRONOLACTONE 25 MG PO TABS: ORAL_TABLET | ORAL | 3 refills | Status: DC

## 2019-08-13 NOTE — Patient Instructions (Signed)
Medication Instructions:  Increase Spironolactone 25 mg twice a day or take 2 tablets every morning Hold Atorvastatin for 2 weeks to see if leg pain improves  *If you need a refill on your cardiac medications before your next appointment, please call your pharmacy*   Lab Work: None ordered    Testing/Procedures: Schedule Echo   Follow-Up: At [facility], you and your health needs are our priority.  As part of our continuing mission to provide you with exceptional heart care, we have created designated Provider Care Teams.  These Care Teams include your primary Cardiologist (physician) and Advanced Practice Providers (APPs -  Physician Assistants and Nurse Practitioners) who all work together to provide you with the care you need, when you need it.  We recommend signing up for the patient portal called "MyChart".  Sign up information is provided on this After Visit Summary.  MyChart is used to connect with patients for Virtual Visits (Telemedicine).  Patients are able to view lab/test results, encounter notes, upcoming appointments, etc.  Non-urgent messages can be sent to your provider as well.   To learn more about what you can do with MyChart, go to [email or web address].    Your next appointment:  3 to 4 weeks   The format for your next appointment:Office     Provider: Dr.Acharya

## 2019-08-13 NOTE — Progress Notes (Signed)
Cardiology Office Note:    Date:  08/13/2019   ID:  Dustin Nicholson, DOB 1970-06-10, MRN [id number]  PCP:  Antony Blackbird, MD  Cardiologist:  No primary care provider on file.  Electrophysiologist:  None   Referring MD: Argentina Donovan, PA-C   Chief Complaint: Heart failure, hypertension  History of Present Illness:    Dustin Nicholson is a 49 y.o. male with a history of hypertension, heart failure, report of coronary artery disease with MI x2 and previous coronary angiography with currently unavailable results, type 2 diabetes.  Patient presents today with no past cardiovascular records available for review, history obtained entirely from patient report which he says he has some trouble remembering.  Primary care records reviewed for collaborative history.  Unclear etiology of his heart failure, however he has an ejection fraction of 35 to 40% from an echocardiogram June 02, 2018.  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  Patient has had some difficulty controlling his blood pressure, but more recently states that with medication adjustments by PCP his blood pressures have been in the 749-449 systolic range, he checks it 2 x a day.  He has previously had blood pressures as high as 210/130. Recently increased his hydralazine from BID to TID, BP with modest improvement.   He tells me he has had a coronary angiogram within the past 10 years.  He feels he does not have stents and has never been referred for bypass surgery.  He was previously on warfarin because he had a "clot somewhere" (this is suggestive of prior LV thrombus due to LV dysfunction), however after approximately 2 years of warfarin this was discontinued at the advisement of his physician.  He has been told that given his reduced ejection fraction he may be in need of an ICD but this was not recommended further.  He  believes he was seen at the [facility] approximately 2 years ago where he underwent echocardiogram and cardiac MRI (by patient report).  He is unsure what the results of this were however was told that his heart failure is likely secondary to hypertension.  Current meds - amlodipine, atorvastatin, carvedilol, furosemide, hydralazine, imudr, spironolactone.   On lisinopril for a while but had angioedema of lip so discontinued.  Metformin recently increased to 1000 mg Bid.   He describes shortness of breath, significant fatigue, bilateral thigh aching, and leg swelling.  The patient denies chest pain, chest pressure, dyspnea at rest, palpitations, PND, orthopnea. Denies cough, fever, chills. Denies nausea, vomiting. Denies syncope or presyncope. Denies dizziness or lightheadedness.    Past Medical History:  Diagnosis Date  . CHF (congestive heart failure) ([place])   . 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 Hypertension   . MI (myocardial infarction) ([place]) 2009/2011   Pt states he has had 2 MI's    No past surgical history on file.  Current Medications: Current Meds  Medication Sig  . amLODipine (NORVASC) 10 MG tablet Take 1 tablet (10 mg total) by mouth daily.  Marland Kitchen atorvastatin (LIPITOR) 20 MG tablet Take 1 tablet (20 mg total) by mouth daily.  . Blood Glucose Monitoring Suppl (TRUE METRIX METER) w/Device KIT Use to check BS up to 3 times per day  . carvedilol (COREG) 25 MG tablet Take 1 tablet (25 mg total) by mouth 2 (two) times daily with a meal.  .  fluticasone (FLONASE) 50 MCG/ACT nasal spray Place 2 sprays into both nostrils daily.  . furosemide (LASIX) 40 MG tablet Take 2 pills twice per day for 3 days then 40 mg in am and 1/2 pill (20 mg ) in early evening  . gabapentin (NEURONTIN) 300 MG capsule Take 1 capsule (300 mg total) by mouth at bedtime.  Marland Kitchen glucose blood (TRUE METRIX BLOOD GLUCOSE TEST) test strip Use as instructed  .  hydrALAZINE (APRESOLINE) 25 MG tablet Take 1 tablet (25 mg total) by mouth 3 (three) times daily.  . isosorbide mononitrate (IMDUR) 30 MG 24 hr tablet Take 1 tablet (30 mg total) by mouth daily.  . metFORMIN (GLUCOPHAGE) 500 MG tablet Take 2 tablets (1,000 mg total) by mouth 2 (two) times daily with a meal.  . TRUEplus Lancets 28G MISC Use when checking blood sugars  . Vitamin D, Ergocalciferol, (DRISDOL) 1.25 MG (50000 UNIT) CAPS capsule Take 1 capsule (50,000 Units total) by mouth every 7 (seven) days.  . [DISCONTINUED] spironolactone (ALDACTONE) 25 MG tablet Take 1 tablet (25 mg total) by mouth daily.     Allergies:   Shellfish allergy   Social History   Socioeconomic History  . Marital status: Significant Other    Spouse name: Not on file  . Number of children: Not on file  . Years of education: Not on file  . Highest education level: Not on file  Occupational History  . Not on file  Tobacco Use  . Smoking status: Never Smoker  . Smokeless tobacco: Never Used  Substance and Sexual Activity  . Alcohol use: Yes    Comment: occ  . Drug use: No  . Sexual activity: Yes  Other Topics Concern  . Not on file  Social History Narrative  . Not on file   Social Determinants of Health   Financial Resource Strain:   . Difficulty of Paying Living Expenses:   Food Insecurity:   . Worried About Charity fundraiser in the Last Year:   . Arboriculturist in the Last Year:   Transportation Needs:   . Film/video editor (Medical):   Marland Kitchen Lack of Transportation (Non-Medical):   Physical Activity:   . Days of Exercise per Week:   . Minutes of Exercise per Session:   Stress:   . Feeling of Stress :   Social Connections:   . Frequency of Communication with Friends and Family:   . Frequency of Social Gatherings with Friends and Family:   . Attends Religious Services:   . Active Member of Clubs or Organizations:   . Attends Archivist Meetings:   Marland Kitchen Marital Status:       Family History: The patient's family history includes Hypertension in his father and mother.  ROS:   Please see the history of present illness.    All other systems reviewed and are negative.  EKGs/Labs/Other Studies Reviewed:    The following studies were reviewed today:  EKG:  NSR, T wave abnormality laterally.   I have independently reviewed the images from Chest Xray 11/25/2018. Cardiomegaly.   Recent Labs: 11/25/2018: Hemoglobin 14.9; Platelets 219 05/06/2019: BNP 61.7 07/28/2019: ALT 18; BUN 7; Creatinine, Ser 1.01; Potassium 4.0; Sodium 141; TSH 2.930  Recent Lipid Panel    Component Value Date/Time   CHOL 169 05/26/2018 1543   TRIG 88 05/26/2018 1543   HDL 39 (L) 05/26/2018 1543   CHOLHDL 4.3 05/26/2018 1543   LDLCALC 112 (H) 05/26/2018 1543  Physical Exam:    VS:  BP (!) 146/88   Pulse 74   Ht '5\' 11"'$  (1.803 m)   Wt 293 lb 6.4 oz (133.1 kg)   BMI 40.92 kg/m     Wt Readings from Last 5 Encounters:  08/13/19 293 lb 6.4 oz (133.1 kg)  07/28/19 290 lb 6.4 oz (131.7 kg)  05/06/19 299 lb (135.6 kg)  11/25/18 296 lb 9.6 oz (134.5 kg)  05/26/18 281 lb (127.5 kg)     Constitutional: No acute distress Eyes: sclera non-icteric, normal conjunctiva and lids ENMT: mask in place Cardiovascular: regular rhythm, normal rate, no murmurs. S1 and S2 normal. Radial pulses normal bilaterally. No jugular venous distention.  Respiratory: clear to auscultation bilaterally GI : normal bowel sounds, soft and nontender. No distention.   MSK: extremities warm, well perfused. 1+ bilateral ankle edema.  NEURO: grossly nonfocal exam, moves all extremities. PSYCH: alert and oriented x 3, normal mood and affect.   ASSESSMENT:    1. Acute on chronic combined systolic and diastolic congestive heart failure ([place])   2. Dyspnea on exertion   3. Lower extremity edema   4. Essential hypertension   5. Coronary artery disease involving native coronary artery of native heart without angina  pectoris   6. Type 2 diabetes mellitus without complication, without long-term current use of insulin (HCC)   7. Pain of left thigh   8. Hyperlipidemia, unspecified hyperlipidemia type   9. LV (left ventricular) mural thrombus    PLAN:    Acute on chronic combined systolic and diastolic congestive heart failure (HCC) Dyspnea on exertion Lower extremity edema - He has worsening symptoms of LE edema, shortness of breath and fatigue, we will need to reevaulate with echocardiogram for status of LV RV function and diastolic function in setting of moderate LVH.  Continue lasix  - Plan: ECHOCARDIOGRAM COMPLETE - he reports having a cardiac MRI within last 2 years. This will be helpful to better understand nature of his HF, we will attempt to get records from [place].  -continue carvedilol. Not on lisinopril due to angioedema.  Essential hypertension - increase dose of spironolactone to 50 mg daily. He will see his PCP in 2 weeks where this can be followed up with labs and repeat BP check.   Coronary artery disease involving native coronary artery of native heart without angina pectoris - unclear history of CAD, report of prior anterior MI by chart review, patient states he has had no stents or referral for CABG. He is not on ASA. Will need records, request obtained.  History of possible LV thrombus by patient report - previously on warfarin, will need echo to reassess.   Type 2 diabetes mellitus without complication, without long-term current use of insulin (HCC) - per PCP, metformin recently increased.   Hyperlipidemia Pain of left thigh - he has had chronic pain in the bilateral thighs. We could hold atorvastatin for 2 weeks until he is seen by his PCP and reevaluate. He has hyperlipidemia, and may have CAD, we will need to explore [id number] if his pain improves and he is felt to be statin intolerant.   Total time of encounter: 80 minutes total time of encounter, including 40 minutes  spent in face-to-face patient care on the date of this encounter. This time includes coordination of care and counseling regarding above mentioned problem list. Remainder of non-face-to-face time involved reviewing chart documents/testing relevant to the patient encounter and documentation in the medical record. I have  independently reviewed documentation from referring provider.  Extensive review with the patient, and subsequent extensive chart, laboratory and imaging review performed.   Cherlynn Kaiser, MD [place]  [facility]    Medication Adjustments/Labs and Tests Ordered: Current medicines are reviewed at length with the patient today.  Concerns regarding medicines are outlined above.  Orders Placed This Encounter  Procedures  . EKG 12-Lead  . ECHOCARDIOGRAM COMPLETE   Meds ordered this encounter  Medications  . spironolactone (ALDACTONE) 25 MG tablet    Sig: Take 25 mg twice a day    Dispense:  180 tablet    Refill:  3    Patient Instructions  Medication Instructions:  Increase Spironolactone 25 mg twice a day or take 2 tablets every morning Hold Atorvastatin for 2 weeks to see if leg pain improves  *If you need a refill on your cardiac medications before your next appointment, please call your pharmacy*   Lab Work: None ordered    Testing/Procedures: Schedule Echo   Follow-Up: At [facility], you and your health needs are our priority.  As part of our continuing mission to provide you with exceptional heart care, we have created designated Provider Care Teams.  These Care Teams include your primary Cardiologist (physician) and Advanced Practice Providers (APPs -  Physician Assistants and Nurse Practitioners) who all work together to provide you with the care you need, when you need it.  We recommend signing up for the patient portal called "MyChart".  Sign up information is provided on this After Visit Summary.  MyChart is used to connect with patients for  Virtual Visits (Telemedicine).  Patients are able to view lab/test results, encounter notes, upcoming appointments, etc.  Non-urgent messages can be sent to your provider as well.   To learn more about what you can do with MyChart, go to [email or web address].    Your next appointment:  3 to 4 weeks   The format for your next appointment:Office     Provider: Dr.Genowefa Morga

## 2019-08-18 ENCOUNTER — Encounter (HOSPITAL_COMMUNITY): Payer: Self-pay | Admitting: *Deleted

## 2019-08-18 ENCOUNTER — Observation Stay (HOSPITAL_COMMUNITY)
Admission: EM | Admit: 2019-08-18 | Discharge: 2019-08-20 | Disposition: A | Discharge status: Home / Self Care | Payer: Self-pay | Attending: Cardiovascular Disease | Admitting: Cardiovascular Disease

## 2019-08-18 ENCOUNTER — Other Ambulatory Visit: Payer: Self-pay

## 2019-08-18 ENCOUNTER — Emergency Department (HOSPITAL_COMMUNITY): Payer: Self-pay

## 2019-08-18 DIAGNOSIS — R079 Chest pain, unspecified: Principal | ICD-10-CM | POA: Diagnosis present

## 2019-08-18 DIAGNOSIS — I5043 Acute on chronic combined systolic (congestive) and diastolic (congestive) heart failure: Secondary | ICD-10-CM | POA: Insufficient documentation

## 2019-08-18 DIAGNOSIS — I110 Hypertensive heart disease with heart failure: Secondary | ICD-10-CM | POA: Insufficient documentation

## 2019-08-18 DIAGNOSIS — Z79899 Other long term (current) drug therapy: Secondary | ICD-10-CM | POA: Insufficient documentation

## 2019-08-18 DIAGNOSIS — Z6838 Body mass index (BMI) 38.0-38.9, adult: Secondary | ICD-10-CM | POA: Insufficient documentation

## 2019-08-18 DIAGNOSIS — Z8249 Family history of ischemic heart disease and other diseases of the circulatory system: Secondary | ICD-10-CM | POA: Insufficient documentation

## 2019-08-18 DIAGNOSIS — R9431 Abnormal electrocardiogram [ECG] [EKG]: Secondary | ICD-10-CM

## 2019-08-18 DIAGNOSIS — I252 Old myocardial infarction: Secondary | ICD-10-CM | POA: Insufficient documentation

## 2019-08-18 DIAGNOSIS — Z91013 Allergy to seafood: Secondary | ICD-10-CM | POA: Insufficient documentation

## 2019-08-18 DIAGNOSIS — Z7984 Long term (current) use of oral hypoglycemic drugs: Secondary | ICD-10-CM | POA: Insufficient documentation

## 2019-08-18 DIAGNOSIS — E785 Hyperlipidemia, unspecified: Secondary | ICD-10-CM | POA: Insufficient documentation

## 2019-08-18 DIAGNOSIS — E119 Type 2 diabetes mellitus without complications: Secondary | ICD-10-CM | POA: Insufficient documentation

## 2019-08-18 DIAGNOSIS — I428 Other cardiomyopathies: Secondary | ICD-10-CM | POA: Insufficient documentation

## 2019-08-18 DIAGNOSIS — E669 Obesity, unspecified: Secondary | ICD-10-CM | POA: Insufficient documentation

## 2019-08-18 DIAGNOSIS — I2510 Atherosclerotic heart disease of native coronary artery without angina pectoris: Secondary | ICD-10-CM | POA: Insufficient documentation

## 2019-08-18 DIAGNOSIS — Z87892 Personal history of anaphylaxis: Secondary | ICD-10-CM | POA: Insufficient documentation

## 2019-08-18 DIAGNOSIS — R778 Other specified abnormalities of plasma proteins: Secondary | ICD-10-CM

## 2019-08-18 LAB — BASIC METABOLIC PANEL
Anion gap: 12 (ref 5–15)
BUN: 15 mg/dL (ref 6–20)
CO2: 23 mmol/L (ref 22–32)
Calcium: 9 mg/dL (ref 8.9–10.3)
Chloride: 108 mmol/L (ref 98–111)
Creatinine, Ser: 1.28 mg/dL — ABNORMAL HIGH (ref 0.61–1.24)
GFR calc Af Amer: >60 mL/min (ref >60)
GFR calc non Af Amer: >60 mL/min (ref >60)
Glucose, Bld: 134 mg/dL — ABNORMAL HIGH (ref 70–99)
Potassium: 3.7 mmol/L (ref 3.5–5.1)
Sodium: 143 mmol/L (ref 135–145)

## 2019-08-18 LAB — BRAIN NATRIURETIC PEPTIDE: B Natriuretic Peptide: 47.5 pg/mL (ref 0.0–100.0)

## 2019-08-18 LAB — TROPONIN I (HIGH SENSITIVITY)
Troponin I (High Sensitivity): 25 ng/L — ABNORMAL HIGH (ref <18)
Troponin I (High Sensitivity): 26 ng/L — ABNORMAL HIGH (ref <18)

## 2019-08-18 LAB — CBC
HCT: 45.8 % (ref 39.0–52.0)
HCT: 44.1 % (ref 39.0–52.0)
Hemoglobin: 14.4 g/dL (ref 13.0–17.0)
Hemoglobin: 14.3 g/dL (ref 13.0–17.0)
MCH: 27.3 pg (ref 26.0–34.0)
MCH: 27.3 pg (ref 26.0–34.0)
MCHC: 31.4 g/dL (ref 30.0–36.0)
MCHC: 32.4 g/dL (ref 30.0–36.0)
MCV: 86.9 fL (ref 80.0–100.0)
MCV: 84.3 fL (ref 80.0–100.0)
Platelets: 231 10*3/uL (ref 150–400)
Platelets: 194 10*3/uL (ref 150–400)
RBC: 5.27 MIL/uL (ref 4.22–5.81)
RBC: 5.23 MIL/uL (ref 4.22–5.81)
RDW: 14.4 % (ref 11.5–15.5)
RDW: 15.2 % (ref 11.5–15.5)
WBC: 7.3 10*3/uL (ref 4.0–10.5)
WBC: 7.4 10*3/uL (ref 4.0–10.5)
nRBC: 0 % (ref 0.0–0.2)
nRBC: 0 % (ref 0.0–0.2)

## 2019-08-18 MED ORDER — FUROSEMIDE 10 MG/ML IJ SOLN: 80.0000 mg | Freq: Two times a day (BID) | INTRAMUSCULAR | Status: DC

## 2019-08-18 MED ORDER — HEPARIN SODIUM (PORCINE) 5000 UNIT/ML IJ SOLN
5000.0000 [IU] | Freq: Three times a day (TID) | INTRAMUSCULAR | Status: DC
  Administered 2019-08-18 – 2019-08-20 (×5): 5000 [IU] via SUBCUTANEOUS
  Filled 2019-08-18 (×5): qty 1

## 2019-08-18 MED ORDER — ONDANSETRON HCL 4 MG/2ML IJ SOLN: 4.0000 mg | Freq: Four times a day (QID) | INTRAMUSCULAR | Status: DC | PRN

## 2019-08-18 MED ORDER — ASPIRIN 81 MG PO CHEW
324.0000 mg | CHEWABLE_TABLET | ORAL | Status: AC
  Administered 2019-08-18: 324 mg via ORAL
  Filled 2019-08-18: qty 4

## 2019-08-18 MED ORDER — ASPIRIN 300 MG RE SUPP: 300.0000 mg | RECTAL | Status: AC

## 2019-08-18 MED ORDER — HYDRALAZINE HCL 25 MG PO TABS
25.0000 mg | ORAL_TABLET | Freq: Three times a day (TID) | ORAL | Status: DC
  Administered 2019-08-18 – 2019-08-20 (×6): 25 mg via ORAL
  Filled 2019-08-18 (×6): qty 1

## 2019-08-18 MED ORDER — NITROGLYCERIN 0.4 MG SL SUBL
0.4000 mg | SUBLINGUAL_TABLET | SUBLINGUAL | Status: DC | PRN
  Administered 2019-08-18 – 2019-08-19 (×3): 0.4 mg via SUBLINGUAL
  Filled 2019-08-18 (×2): qty 1

## 2019-08-18 MED ORDER — CARVEDILOL 25 MG PO TABS
25.0000 mg | ORAL_TABLET | Freq: Two times a day (BID) | ORAL | Status: DC
  Administered 2019-08-19 – 2019-08-20 (×3): 25 mg via ORAL
  Filled 2019-08-18 (×2): qty 1
  Filled 2019-08-18: qty 2

## 2019-08-18 MED ORDER — FUROSEMIDE 10 MG/ML IJ SOLN
80.0000 mg | Freq: Two times a day (BID) | INTRAMUSCULAR | Status: AC
  Administered 2019-08-18 – 2019-08-19 (×3): 80 mg via INTRAVENOUS
  Filled 2019-08-18 (×3): qty 8

## 2019-08-18 MED ORDER — ASPIRIN EC 81 MG PO TBEC
81.0000 mg | DELAYED_RELEASE_TABLET | Freq: Every day | ORAL | Status: DC
  Administered 2019-08-19 – 2019-08-20 (×2): 81 mg via ORAL
  Filled 2019-08-18 (×2): qty 1

## 2019-08-18 MED ORDER — ACETAMINOPHEN 325 MG PO TABS
650.0000 mg | ORAL_TABLET | Freq: Once | ORAL | Status: AC
  Administered 2019-08-18: 650 mg via ORAL
  Filled 2019-08-18: qty 2

## 2019-08-18 MED ORDER — NITROGLYCERIN 0.4 MG SL SUBL
0.4000 mg | SUBLINGUAL_TABLET | SUBLINGUAL | Status: DC | PRN
  Administered 2019-08-18: 23:00:00 0.4 mg via SUBLINGUAL
  Filled 2019-08-18: qty 1

## 2019-08-18 MED ORDER — SODIUM CHLORIDE 0.9% FLUSH: 3.0000 mL | Freq: Once | INTRAVENOUS | Status: DC

## 2019-08-18 MED ORDER — ASPIRIN EC 325 MG PO TBEC
325.0000 mg | DELAYED_RELEASE_TABLET | Freq: Once | ORAL | Status: AC
  Administered 2019-08-18: 325 mg via ORAL
  Filled 2019-08-18: qty 1

## 2019-08-18 MED ORDER — AMLODIPINE BESYLATE 10 MG PO TABS
10.0000 mg | ORAL_TABLET | Freq: Every day | ORAL | Status: DC
  Administered 2019-08-18 – 2019-08-20 (×3): 10 mg via ORAL
  Filled 2019-08-18 (×2): qty 2
  Filled 2019-08-18: qty 1

## 2019-08-18 MED ORDER — SPIRONOLACTONE 25 MG PO TABS
50.0000 mg | ORAL_TABLET | Freq: Every day | ORAL | Status: DC
  Filled 2019-08-18: qty 2

## 2019-08-18 MED ORDER — ISOSORBIDE MONONITRATE ER 30 MG PO TB24
30.0000 mg | ORAL_TABLET | Freq: Every day | ORAL | Status: DC
  Administered 2019-08-18 – 2019-08-20 (×3): 30 mg via ORAL
  Filled 2019-08-18 (×2): qty 1

## 2019-08-18 MED ORDER — ACETAMINOPHEN 325 MG PO TABS: 650.0000 mg | ORAL_TABLET | ORAL | Status: DC | PRN

## 2019-08-18 MED ORDER — ATORVASTATIN CALCIUM 10 MG PO TABS
20.0000 mg | ORAL_TABLET | Freq: Every day | ORAL | Status: DC
  Administered 2019-08-18 – 2019-08-20 (×3): 20 mg via ORAL
  Filled 2019-08-18 (×3): qty 2

## 2019-08-18 NOTE — ED Triage Notes (Signed)
Pt reports left side chest pain that started yesterday. Denies sob or n/v. Has hx of chf and reports recent increase in swelling. No distress noted at triage.

## 2019-08-18 NOTE — ED Notes (Signed)
Dustin Nicholson would like a call with any pt updates [phone] ; has specific questions about the cath lab and how long pt will be here afterwards.

## 2019-08-18 NOTE — ED Provider Notes (Signed)
[facility] EMERGENCY DEPARTMENT Provider Note   CSN: [id number] Arrival date & time: 08/18/19  1015     History Chief Complaint  Patient presents with  . Chest Pain    Dustin Nicholson is a 49 y.o. male.  HPI 49 year old male with a history of HTN, CHF with ejection fraction of 35 to 40% as of 06/02/2018, CAD with 2 MIs, previous coronary angiography presents to the ED with acute onset of chest pain that occurred around 3 PM last night.  Patient describes it as sharp, squeezing, pressure over the left side of his chest that does not radiate.  Patient states that the pain has been constant, is not worse on exertion, not improved with rest.  Remains unchanged at all times.  He has not taking anything for this pain.  He notes increasing in swelling of his legs over the last 2 days.  Reports compliance with medications. He is not on a blood thinner. He denies any shortness of breath, nausea, vomiting, back pain, abdominal pain, dizziness, syncope.  Upon chart review, patient was seen on 08/13/2019 at  [facility] with Dr. Margaretann Loveless for increasing shortness of breath, plan was to repeat echo.  He also follows with [place] [facility] for his blood pressure medications.    Past Medical History:  Diagnosis Date  . CHF (congestive heart failure) ([place])   . 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 Hypertension   . MI (myocardial infarction) ([place]) 2009/2011   Pt states he has had 2 MI's    Patient Active Problem List   Diagnosis Date Noted  . Type 2 diabetes mellitus without complication, without long-term current use of insulin ([place]) 05/26/2018  . Pain of left thigh 05/26/2018  . Chronic left-sided low back pain without sciatica 05/26/2018  . Encounter for screening for HIV 05/26/2018  . Acute on chronic combined systolic and diastolic congestive heart failure ([place]) 05/26/2018  . Coronary artery disease involving native heart without angina pectoris  05/26/2018  . HTN (hypertension) 05/26/2018    History reviewed. No pertinent surgical history.     Family History  Problem Relation Age of Onset  . Hypertension Mother   . Hypertension Father     Social History   Tobacco Use  . Smoking status: Never Smoker  . Smokeless tobacco: Never Used  Substance Use Topics  . Alcohol use: Yes    Comment: occ  . Drug use: No    Home Medications Prior to Admission medications   Medication Sig Start Date End Date Taking? Authorizing Provider  amLODipine (NORVASC) 10 MG tablet Take 1 tablet (10 mg total) by mouth daily. 05/06/19 08/13/19  Fulp, Cammie, MD  atorvastatin (LIPITOR) 20 MG tablet Take 1 tablet (20 mg total) by mouth daily. 05/06/19 08/13/19  Fulp, Cammie, MD  Blood Glucose Monitoring Suppl (TRUE METRIX METER) w/Device KIT Use to check BS up to 3 times per day 05/06/19   Fulp, Ander Gaster, MD  carvedilol (COREG) 25 MG tablet Take 1 tablet (25 mg total) by mouth 2 (two) times daily with a meal. 05/06/19 08/13/19  Fulp, Cammie, MD  fluticasone (FLONASE) 50 MCG/ACT nasal spray Place 2 sprays into both nostrils daily. 07/28/19   Argentina Donovan, PA-C  furosemide (LASIX) 40 MG tablet Take 2 pills twice per day for 3 days then 40 mg in am and 1/2 pill (20 mg ) in early evening 05/06/19   Fulp, Cammie, MD  gabapentin (NEURONTIN) 300 MG capsule  Take 1 capsule (300 mg total) by mouth at bedtime. 07/28/19   Argentina Donovan, PA-C  glucose blood (TRUE METRIX BLOOD GLUCOSE TEST) test strip Use as instructed 05/06/19   Fulp, Cammie, MD  hydrALAZINE (APRESOLINE) 25 MG tablet Take 1 tablet (25 mg total) by mouth 3 (three) times daily. 07/28/19 08/27/19  Argentina Donovan, PA-C  isosorbide mononitrate (IMDUR) 30 MG 24 hr tablet Take 1 tablet (30 mg total) by mouth daily. 05/06/19   Fulp, Ander Gaster, MD  metFORMIN (GLUCOPHAGE) 500 MG tablet Take 2 tablets (1,000 mg total) by mouth 2 (two) times daily with a meal. 07/28/19 10/26/19  Argentina Donovan, PA-C  spironolactone  (ALDACTONE) 25 MG tablet Take 25 mg twice a day 08/13/19   Elouise Munroe, MD  TRUEplus Lancets 28G MISC Use when checking blood sugars 05/06/19   Fulp, Cammie, MD  Vitamin D, Ergocalciferol, (DRISDOL) 1.25 MG (50000 UNIT) CAPS capsule Take 1 capsule (50,000 Units total) by mouth every 7 (seven) days. 07/29/19   Argentina Donovan, PA-C    Allergies    Shellfish allergy  Review of Systems   Review of Systems  Constitutional: Negative for chills and fever.  HENT: Negative for ear pain and sore throat.   Eyes: Negative for pain and visual disturbance.  Respiratory: Positive for chest tightness. Negative for cough and shortness of breath.   Cardiovascular: Positive for chest pain and leg swelling. Negative for palpitations.  Gastrointestinal: Negative for abdominal pain, nausea and vomiting.  Genitourinary: Negative for dysuria and hematuria.  Musculoskeletal: Negative for arthralgias and back pain.  Skin: Negative for color change and rash.  Neurological: Negative for dizziness, seizures, syncope, weakness and light-headedness.  Psychiatric/Behavioral: Negative for confusion.  All other systems reviewed and are negative.   Physical Exam Updated Vital Signs BP 139/84   Pulse 65   Temp 98.8 F (37.1 C) (Oral)   Resp (!) 22   Ht 5' 11"  (1.803 m)   Wt 132 kg   SpO2 97%   BMI 40.59 kg/m   Physical Exam Vitals reviewed.  Constitutional:      General: He is not in acute distress.    Appearance: Normal appearance. He is obese. He is not ill-appearing, toxic-appearing or diaphoretic.  HENT:     Head: Normocephalic.  Eyes:     Extraocular Movements: Extraocular movements intact.     Pupils: Pupils are equal, round, and reactive to light.  Neck:     Vascular: No hepatojugular reflux or JVD.  Cardiovascular:     Rate and Rhythm: Normal rate and regular rhythm.     Pulses: Normal pulses.          Radial pulses are 2+ on the right side and 2+ on the left side.       Dorsalis pedis  pulses are 2+ on the right side and 2+ on the left side.     Heart sounds: Normal heart sounds, S1 normal and S2 normal. Heart sounds not distant. No murmur. No friction rub.  Pulmonary:     Effort: Pulmonary effort is normal.     Breath sounds: Normal breath sounds. No decreased breath sounds or wheezing.  Chest:     Chest wall: No tenderness or edema.  Abdominal:     General: Abdomen is flat. Bowel sounds are normal.     Palpations: Abdomen is soft.  Musculoskeletal:     Cervical back: Normal range of motion and neck supple.     Right lower  leg: 1+ Edema present.     Left lower leg: 1+ Edema present.  Skin:    General: Skin is warm and dry.     Capillary Refill: Capillary refill takes less than 2 seconds.     Coloration: Skin is not cyanotic or pale.     Findings: No erythema.  Neurological:     General: No focal deficit present.     Mental Status: He is alert and oriented to person, place, and time.     ED Results / Procedures / Treatments   Labs (all labs ordered are listed, but only abnormal results are displayed) Labs Reviewed  BASIC METABOLIC PANEL - Abnormal; Notable for the following components:      Result Value   Glucose, Bld 134 (*)    Creatinine, Ser 1.28 (*)    All other components within normal limits  TROPONIN I (HIGH SENSITIVITY) - Abnormal; Notable for the following components:   Troponin I (High Sensitivity) 25 (*)    All other components within normal limits  CBC  BRAIN NATRIURETIC PEPTIDE  TROPONIN I (HIGH SENSITIVITY)    EKG EKG Interpretation  Date/Time:  Wednesday August 18 2019 10:19:09 EDT Ventricular Rate:  91 PR Interval:  194 QRS Duration: 90 QT Interval:  382 QTC Calculation: 469 R Axis:   -19 Text Interpretation: Sinus rhythm with occasional Premature ventricular complexes Nonspecific ST and T wave abnormality Prolonged QT Abnormal ECG t wave changes have resolved Confirmed by Isla Pence [phone]) on 08/18/2019 6:53:08  PM   Radiology DG Chest 2 View  Result Date: 08/18/2019 CLINICAL DATA:  Chest pain EXAM: CHEST - 2 VIEW COMPARISON:  11/25/2018 FINDINGS: Cardiomegaly. No confluent opacities, effusions or edema. No acute bony abnormality. IMPRESSION: Cardiomegaly.  No active disease. Electronically Signed   By: Rolm Baptise M.D.   On: 08/18/2019 11:23    Procedures Procedures (including critical care time)  Medications Ordered in ED Medications  sodium chloride flush (NS) 0.9 % injection 3 mL (3 mLs Intravenous Not Given 08/18/19 1852)  nitroGLYCERIN (NITROSTAT) SL tablet 0.4 mg (has no administration in time range)  aspirin EC tablet 325 mg (has no administration in time range)    ED Course  I have reviewed the triage vital signs and the nursing notes.  Pertinent labs & imaging results that were available during my care of the patient were reviewed by me and considered in my medical decision making (see chart for details).    MDM Rules/Calculators/A&P                     49 year old male with constant chest pain since yesterday. On presentation, patient is alert and oriented, no acute distress, with no increased work of breathing, nondiaphoretic. Vitals overall reassuring. Physical exam positive for 1+ edema in lower extremities bilaterally with no other abnormalities. Patient does not complain of shortness of breath or back pain at this time.  Per chart review, patient has a difficult and unclear cardiac history as the patient is a poor historian. Initial troponin 25, he does not have a history of elevated troponin. We will continue to trend.  EKG with sinus rhythm with occasional PVCs, nonspecific T wave abnormalities which have now resolved, also with long QT.  Possible evidence of a mild AKI with a creatinine of 1.28, previous result from 3 weeks ago 1.08.  BMP without significant electrolyte abnormalities.  Evidence of elevated glucose of 134.  CBC without leukocytosis, normal hemoglobin.  Chest  x-ray  without caution fluids given CHF.  BNP and second troponin pending.  Patient received 1 nitro and aspirin in the ED.  Plan to admit to medicine for observation.  Consulted Dr. Margaretann Loveless with [facility], she has agreed to admit the patient for further work up.  On reevaluation, the patient is not in acute distress.  Will defer further management to Dr. Margaretann Loveless.  Patient was seen and evaluated by Dr. Gilford Raid and she is agreeable to the above plan.   Final Clinical Impression(s) / ED Diagnoses Final diagnoses:  Chest pain, unspecified type  Elevated troponin  Long QT interval    Rx / DC Orders ED Discharge Orders    None       Lyndel Safe 08/18/19 Lamar Benes, MD 08/18/19 2037

## 2019-08-18 NOTE — H&P (Signed)
CARDIOLOGY ADMISSION NOTE  Patient ID: Dustin Nicholson MRN: [id number] DOB/AGE: [birth year] 49 y.o.  Admit date: 08/18/2019 Primary Physician   [facility] Primary Cardiologist  Dr. Margaretann Loveless Chief Complaint    Constant chest pain  HPI: Dustin Nicholson is a pleasant 49 year old gentleman who I recently saw in the office on 08/13/2019 to establish care for nonischemic cardiomyopathy with worsening shortness of breath.  His past medical history also includes hypertension, dyslipidemia, obesity, diabetes.  When he saw me in the office he was not having chest pain, but tells me that his chest pain started yesterday at 3 PM and has been constant.  No aggravating or alleviating factors have been identified.  He has been noted to have a coronary angiogram in the past which was reportedly not revealing for obstructive CAD.  He also saw Dr. Benita Stabile at [facility] December 20, 2016, and was planned to have a coronary CTA.  The results of this study are not available, however I presume this is what the patient was referring to when he said he had a cardiac MRI.  He told me results of testing were unremarkable.  He recently moved to [facility] and is establishing cardiovascular care in our group.  He has had intermittent difficulty with medication compliance.  LVEF has been as low as 25%, and an echo performed in our system February 2020 demonstrates an EF of 35 to 40%.  Today he has 1+ pitting edema of the lower extremities, continues to have some shortness of breath that he describes to me last week in clinic.   ECG in the office last week showed T wave inversions laterally, ECG today demonstrates T wave inversions in the high lateral leads I and aVL.  Troponins are 25 and 26 on presentation today, trending flat.   Past Medical History:  Diagnosis Date  . CHF (congestive heart failure) ([place])   . 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 Hypertension   . MI  (myocardial infarction) ([place]) 2009/2011   Pt states he has had 2 MI's    History reviewed. No pertinent surgical history.  Allergies  Allergen Reactions  . Shellfish Allergy Anaphylaxis   No current facility-administered medications on file prior to encounter.   Current Outpatient Medications on File Prior to Encounter  Medication Sig Dispense Refill  . amLODipine (NORVASC) 10 MG tablet Take 1 tablet (10 mg total) by mouth daily. 30 tablet 6  . atorvastatin (LIPITOR) 20 MG tablet Take 1 tablet (20 mg total) by mouth daily. 30 tablet 4  . Blood Glucose Monitoring Suppl (TRUE METRIX METER) w/Device KIT Use to check BS up to 3 times per day 1 kit 0  . carvedilol (COREG) 25 MG tablet Take 1 tablet (25 mg total) by mouth 2 (two) times daily with a meal. 60 tablet 4  . fluticasone (FLONASE) 50 MCG/ACT nasal spray Place 2 sprays into both nostrils daily. 16 g 6  . furosemide (LASIX) 40 MG tablet Take 2 pills twice per day for 3 days then 40 mg in am and 1/2 pill (20 mg ) in early evening 60 tablet 4  . gabapentin (NEURONTIN) 300 MG capsule Take 1 capsule (300 mg total) by mouth at bedtime. 90 capsule 3  . glucose blood (TRUE METRIX BLOOD GLUCOSE TEST) test strip Use as instructed 100 each 12  . hydrALAZINE (APRESOLINE) 25 MG tablet Take 1 tablet (25 mg total) by mouth 3 (three) times daily. [place]  tablet 4  . isosorbide mononitrate (IMDUR) 30 MG 24 hr tablet Take 1 tablet (30 mg total) by mouth daily. 30 tablet 6  . metFORMIN (GLUCOPHAGE) 500 MG tablet Take 2 tablets (1,000 mg total) by mouth 2 (two) times daily with a meal. 120 tablet 3  . spironolactone (ALDACTONE) 25 MG tablet Take 25 mg twice a day 180 tablet 3  . TRUEplus Lancets 28G MISC Use when checking blood sugars 100 each 6  . Vitamin D, Ergocalciferol, (DRISDOL) 1.25 MG (50000 UNIT) CAPS capsule Take 1 capsule (50,000 Units total) by mouth every 7 (seven) days. 16 capsule 0   Social History   Socioeconomic History  . Marital status:  Significant Other    Spouse name: Not on file  . Number of children: Not on file  . Years of education: Not on file  . Highest education level: Not on file  Occupational History  . Not on file  Tobacco Use  . Smoking status: Never Smoker  . Smokeless tobacco: Never Used  Substance and Sexual Activity  . Alcohol use: Yes    Comment: occ  . Drug use: No  . Sexual activity: Yes  Other Topics Concern  . Not on file  Social History Narrative  . Not on file   Social Determinants of Health   Financial Resource Strain:   . Difficulty of Paying Living Expenses:   Food Insecurity:   . Worried About Charity fundraiser in the Last Year:   . Arboriculturist in the Last Year:   Transportation Needs:   . Film/video editor (Medical):   Marland Kitchen Lack of Transportation (Non-Medical):   Physical Activity:   . Days of Exercise per Week:   . Minutes of Exercise per Session:   Stress:   . Feeling of Stress :   Social Connections:   . Frequency of Communication with Friends and Family:   . Frequency of Social Gatherings with Friends and Family:   . Attends Religious Services:   . Active Member of Clubs or Organizations:   . Attends Archivist Meetings:   Marland Kitchen Marital Status:   Intimate Partner Violence:   . Fear of Current or Ex-Partner:   . Emotionally Abused:   Marland Kitchen Physically Abused:   . Sexually Abused:     Family History  Problem Relation Age of Onset  . Hypertension Mother   . Hypertension Father      ROS:  Per hpi, otherwise negative  Physical Exam: Blood pressure 139/84, pulse 65, temperature 98.8 F (37.1 C), temperature source Oral, resp. rate (!) 22, height 5' 11"  (1.803 m), weight 132 kg, SpO2 97 %.  Constitutional: No acute distress Eyes: sclera non-icteric, normal conjunctiva and lids ENMT: Mask over nose and mouth Cardiovascular: regular rhythm, normal rate, no murmurs. S1 and S2 normal. Radial pulses normal bilaterally. No jugular venous distention.    Respiratory: clear to auscultation bilaterally GI : normal bowel sounds, soft and nontender. No distention.   MSK: extremities warm, well perfused. No edema.  NEURO: grossly nonfocal exam, moves all extremities. PSYCH: alert and oriented x 3, normal mood and affect.   Labs: Lab Results  Component Value Date   BUN 15 08/18/2019   Lab Results  Component Value Date   CREATININE 1.28 (H) 08/18/2019   Lab Results  Component Value Date   NA 143 08/18/2019   K 3.7 08/18/2019   CL 108 08/18/2019   CO2 23 08/18/2019   No results  found for: TROPONINI Lab Results  Component Value Date   WBC 7.3 08/18/2019   HGB 14.4 08/18/2019   HCT 45.8 08/18/2019   MCV 86.9 08/18/2019   PLT 231 08/18/2019   Lab Results  Component Value Date   CHOL 169 05/26/2018   HDL 39 (L) 05/26/2018   LDLCALC 112 (H) 05/26/2018   TRIG 88 05/26/2018   CHOLHDL 4.3 05/26/2018   Lab Results  Component Value Date   ALT 18 07/28/2019   AST 13 07/28/2019   ALKPHOS 83 07/28/2019   BILITOT 0.6 07/28/2019      Radiology:  CXR: Cardiomegaly  EKG: Sinus rhythm, PVC, T wave inversions 1 and aVL.  ASSESSMENT AND PLAN:   Chest pain, unspecified type -Troponins have trended flat, ECG shows no definite ischemic change, and chest pain is somewhat atypical given that it is constant since yesterday afternoon.  Will observe the patient overnight and discuss further testing in the morning. -The patient and I have plan to repeat an echocardiogram as an outpatient for repeat assessment of EF in the setting of shortness of breath and heart failure, obtain echo in the morning. Must give Definity for possible prior history of LV Thrombus and chest pain. -The patient describes having a cardiac MRI and per outside records may have also had a cardiac CT within the past 3 years.  Would recommend obtaining records from [facility] for these 2 specific studies.  Could consider repeat stress testing tomorrow  or this can be performed close outpatient follow-up.  Acute on chronic combined systolic and diastolic heart failure-patient has had worsening shortness of breath and lower extremity edema, and had recently restarted his Lasix after not having a prescription.  Given that he is in hospital and has 1+ pitting edema, we will provide IV Lasix for diuresis and monitor response.  Continue carvedilol 25 mg twice daily.  Hypertension-continue spironolactone, dose was recently increased at our appointment to 50 mg daily.  Continue amlodipine 10 mg daily, carvedilol 25 mg twice daily, hydralazine 25 mg 3 times daily, Imdur 30 mg daily, spironolactone 50 mg daily,  CAD with prior MI per patient report-patient reports a history of MI, but has had a prior coronary angiogram as was a cardiac CTA.  Unclear history of CAD.  Continue aspirin 81 mg daily.  Continue atorvastatin 20 mg daily  History of possible LV thrombus by patient report - previously on warfarin, will need echo to reassess.   Type 2 diabetes mellitus without complication, without long-term current use of insulin (HCC) - per PCP, metformin recently increased.     Signed: Elouise Munroe 08/18/2019, 7:46 PM

## 2019-08-19 ENCOUNTER — Encounter (HOSPITAL_COMMUNITY): Payer: Self-pay | Admitting: Internal Medicine

## 2019-08-19 ENCOUNTER — Observation Stay (HOSPITAL_BASED_OUTPATIENT_CLINIC_OR_DEPARTMENT_OTHER): Payer: Self-pay

## 2019-08-19 DIAGNOSIS — I5043 Acute on chronic combined systolic (congestive) and diastolic (congestive) heart failure: Secondary | ICD-10-CM

## 2019-08-19 LAB — GLUCOSE, CAPILLARY
Glucose-Capillary: 200 mg/dL — ABNORMAL HIGH (ref 70–99)
Glucose-Capillary: 139 mg/dL — ABNORMAL HIGH (ref 70–99)
Glucose-Capillary: 121 mg/dL — ABNORMAL HIGH (ref 70–99)

## 2019-08-19 LAB — BASIC METABOLIC PANEL
Anion gap: 14 (ref 5–15)
BUN: 14 mg/dL (ref 6–20)
CO2: 24 mmol/L (ref 22–32)
Calcium: 8.5 mg/dL — ABNORMAL LOW (ref 8.9–10.3)
Chloride: 103 mmol/L (ref 98–111)
Creatinine, Ser: 1.2 mg/dL (ref 0.61–1.24)
GFR calc Af Amer: >60 mL/min (ref >60)
GFR calc non Af Amer: >60 mL/min (ref >60)
Glucose, Bld: 128 mg/dL — ABNORMAL HIGH (ref 70–99)
Potassium: 3.5 mmol/L (ref 3.5–5.1)
Sodium: 141 mmol/L (ref 135–145)

## 2019-08-19 LAB — CBC
HCT: 47.1 % (ref 39.0–52.0)
Hemoglobin: 14.6 g/dL (ref 13.0–17.0)
MCH: 26.8 pg (ref 26.0–34.0)
MCHC: 31 g/dL (ref 30.0–36.0)
MCV: 86.6 fL (ref 80.0–100.0)
Platelets: 212 10*3/uL (ref 150–400)
RBC: 5.44 MIL/uL (ref 4.22–5.81)
RDW: 14.6 % (ref 11.5–15.5)
WBC: 5.9 10*3/uL (ref 4.0–10.5)
nRBC: 0 % (ref 0.0–0.2)

## 2019-08-19 LAB — CBG MONITORING, ED: Glucose-Capillary: 131 mg/dL — ABNORMAL HIGH (ref 70–99)

## 2019-08-19 LAB — ECHOCARDIOGRAM COMPLETE
Height: 71 in
Weight: 4452.8 oz

## 2019-08-19 LAB — LIPID PANEL
Cholesterol: 177 mg/dL (ref 0–200)
HDL: 31 mg/dL — ABNORMAL LOW (ref >40)
LDL Cholesterol: 120 mg/dL — ABNORMAL HIGH (ref 0–99)
Total CHOL/HDL Ratio: 5.7 RATIO
Triglycerides: 129 mg/dL (ref <150)
VLDL: 26 mg/dL (ref 0–40)

## 2019-08-19 LAB — HIV ANTIBODY (ROUTINE TESTING W REFLEX): HIV Screen 4th Generation wRfx: NONREACTIVE

## 2019-08-19 LAB — HEMOGLOBIN A1C
Hgb A1c MFr Bld: 7.9 % — ABNORMAL HIGH (ref 4.8–5.6)
Mean Plasma Glucose: 180.03 mg/dL

## 2019-08-19 LAB — TROPONIN I (HIGH SENSITIVITY)
Troponin I (High Sensitivity): 17 ng/L (ref <18)
Troponin I (High Sensitivity): 26 ng/L — ABNORMAL HIGH (ref <18)

## 2019-08-19 MED ORDER — SPIRONOLACTONE 25 MG PO TABS
25.0000 mg | ORAL_TABLET | Freq: Two times a day (BID) | ORAL | Status: DC
  Administered 2019-08-19 – 2019-08-20 (×3): 25 mg via ORAL
  Filled 2019-08-19 (×4): qty 1

## 2019-08-19 MED ORDER — INSULIN ASPART 100 UNIT/ML ~~LOC~~ SOLN
0.0000 [IU] | Freq: Three times a day (TID) | SUBCUTANEOUS | Status: DC
  Administered 2019-08-19: 3 [IU] via SUBCUTANEOUS
  Administered 2019-08-19: 08:00:00 2 [IU] via SUBCUTANEOUS
  Administered 2019-08-19: 3 [IU] via SUBCUTANEOUS
  Administered 2019-08-20: 08:00:00 2 [IU] via SUBCUTANEOUS

## 2019-08-19 MED ORDER — PERFLUTREN LIPID MICROSPHERE
1.0000 mL | INTRAVENOUS | Status: AC | PRN
  Administered 2019-08-19: 5 mL via INTRAVENOUS
  Filled 2019-08-19: qty 10

## 2019-08-19 MED ORDER — HYDRALAZINE HCL 25 MG PO TABS: 25.0000 mg | ORAL_TABLET | Freq: Three times a day (TID) | ORAL | Status: DC

## 2019-08-19 MED ORDER — MORPHINE SULFATE (PF) 2 MG/ML IV SOLN
2.0000 mg | Freq: Once | INTRAVENOUS | Status: AC
  Administered 2019-08-19: 2 mg via INTRAVENOUS
  Filled 2019-08-19: qty 1

## 2019-08-19 MED ORDER — DIPHENHYDRAMINE HCL 50 MG/ML IJ SOLN
25.0000 mg | Freq: Once | INTRAMUSCULAR | Status: AC
  Administered 2019-08-19: 25 mg via INTRAVENOUS
  Filled 2019-08-19: qty 1

## 2019-08-19 NOTE — Plan of Care (Signed)
Pt admitted from the ED and understanding of plan of care.

## 2019-08-19 NOTE — Progress Notes (Signed)
  Echocardiogram 2D Echocardiogram has been performed.  Delcie Roch 08/19/2019, 3:24 PM

## 2019-08-19 NOTE — ED Notes (Signed)
Breakfast ordered 

## 2019-08-19 NOTE — Progress Notes (Addendum)
Progress Note  Patient Name: Dustin Nicholson Date of Encounter: 08/19/2019  Primary Cardiologist: Dr. Jacques Navy  Subjective   Reports continual chest pain for the last several days. Has new upper right arm and right neck rash this AM>given Benedryl.   Is very sleepy following his benadryl   Inpatient Medications    Scheduled Meds: . amLODipine  10 mg Oral Daily  . aspirin EC  81 mg Oral Daily  . atorvastatin  20 mg Oral Daily  . carvedilol  25 mg Oral BID WC  . furosemide  80 mg Intravenous BID  . heparin  5,000 Units Subcutaneous Q8H  . hydrALAZINE  25 mg Oral Q8H  . isosorbide mononitrate  30 mg Oral Daily  . sodium chloride flush  3 mL Intravenous Once  . spironolactone  50 mg Oral Daily   Continuous Infusions:  PRN Meds: acetaminophen, nitroGLYCERIN, nitroGLYCERIN, ondansetron (ZOFRAN) IV   Vital Signs    Vitals:   08/18/19 2200 08/19/19 0002 08/19/19 0348 08/19/19 0538  BP: (!) 132/94 133/88 (!) 137/93 138/90  Pulse: 69 65 60 60  Resp: 12 15 12 16   Temp:   98 F (36.7 C)   TempSrc:   Oral   SpO2: 97% 95% 97% 100%  Weight:      Height:       No intake or output data in the 24 hours ending 08/19/19 0636 Filed Weights   08/18/19 1021  Weight: 132 kg    Physical Exam   General: Overweight, NAD Skin: Warm, dry, intact, right upper arm and neck rash Neck: Negative for carotid bruits. No JVD Lungs:Clear to ausculation bilaterally. Breathing is unlabored. Cardiovascular: RRR with S1 S2. No murmur Abdomen: Soft, non-tender, non-distended. No obvious abdominal masses. Extremities: No edema. Radial pulses 2+ bilaterally Neuro: Alert and oriented. No focal deficits. No facial asymmetry. MAE spontaneously. Psych: Responds to questions appropriately with normal affect.    Labs    Chemistry Recent Labs  Lab 08/18/19 1139  NA 143  K 3.7  CL 108  CO2 23  GLUCOSE 134*  BUN 15  CREATININE 1.28*  CALCIUM 9.0  GFRNONAA >60  GFRAA >60  ANIONGAP 12       Hematology Recent Labs  Lab 08/18/19 1139 08/18/19 2200  WBC 7.3 7.4  RBC 5.27 5.23  HGB 14.4 14.3  HCT 45.8 44.1  MCV 86.9 84.3  MCH 27.3 27.3  MCHC 31.4 32.4  RDW 14.4 15.2  PLT 231 194    Cardiac EnzymesNo results for input(s): TROPONINI in the last 168 hours. No results for input(s): TROPIPOC in the last 168 hours.   BNP Recent Labs  Lab 08/18/19 2000  BNP 47.5     DDimer No results for input(s): DDIMER in the last 168 hours.   Radiology    DG Chest 2 View  Result Date: 08/18/2019 CLINICAL DATA:  Chest pain EXAM: CHEST - 2 VIEW COMPARISON:  11/25/2018 FINDINGS: Cardiomegaly. No confluent opacities, effusions or edema. No acute bony abnormality. IMPRESSION: Cardiomegaly.  No active disease. Electronically Signed   By: 01/25/2019 M.D.   On: 08/18/2019 11:23   Telemetry    08/19/19 SB with rates in the upper 50's- Personally Reviewed  ECG    No new tracing as of 08/19/19- Personally Reviewed  Cardiac Studies   Obtaining outside records   Patient Profile     49 y.o. male with a hx of nonischemic cardiomyopathy with worsening shortness of breath, hypertension, dyslipidemia, obesity, and  DM2 who presented to [facility] on 08/18/19 with worsening SOB and chest pain.   Assessment & Plan    1. Chest pain: -Presented with worsening SOB and chest pain for the last several days. States his pain has been continual and he cannot determine exacerbating or relieving factors. Does report associated SOB and possible diaphoresis. Recently seen by Dr. Margaretann Loveless to establish care with plans to pursue repeat echocardiogram given known hx of non-ischemic cardiomyopathy. Unable to obtain records at this time (from [facility] [employer] [facility] and office in [place]). -hsT, 25>26>17>26 with no definitive ischemic changes on EKG -Would consider obtaining coronary CTA given symptoms and unclear prior cardiac hx as well as stable creatinine and slow HR. -Pt has been NPO -Discuss with MD   2. Acute on chronic  combined systolic and diastolic heart failure: -Progressive SOB and LE edema>>recently started on OP Lasix  -Lasix transitioned to IV for diuresis -Weight, 291lb today   -I&O, no UO documented  -Continue carvedilol 25 mg twice daily, IV Lasix -Will restart home spiro -?ARB poossibly not started due to mild creatinine elevation -Obtain echo for comparison   3. Hypertension: -Stable, 138/90>137/93>133/88 -Continue spironolactone>>recent dose titration at last OV to 50 PO QD -Continue amlodipine 10 mg daily, carvedilol 25 mg twice daily, Imdur 30 mg daily -Home hydralazine 25 mg 3 times daily and spiro 50 currently on hold    4. CAD with prior MI per patient report: -Reports a history of MI, but has had a prior coronary angiogram as was a cardiac CTA.  -Unclear history of CAD.   -Continue aspirin 81, statin and BB  5. History of possible LV thrombus by patient report -Plan for repeat echocardiogram today  -Previously noted to be on Coumadin   6. DM2: -Last HbA1c, 7.8 on 05/06/19 -SSI for glucose control while inpatient status   Signed, Kathyrn Drown NP-C [facility] Pager: [phone] 08/19/2019, 6:36 AM     For questions or updates, please contact   Please consult [email or web address] for contact info under Cardiology/STEMI.  Attending Note:   The patient was seen and examined.  Agree with assessment and plan as noted above.  Changes made to the above note as needed.  Patient seen and independently examined with Kathyrn Drown, NP .   We discussed all aspects of the encounter. I agree with the assessment and plan as stated above.  1.   Chest pain :   trops are minimally elevated and not c/w ACS. I think a coronary CTA would be our best option Will arrange for today  2.  Acute on chronic combined CHF: He has an ejection fraction of 35 to 40% and grade 1 diastolic dysfunction.  Current medications include amlodipine 10 mg a day, carvedilol 25 mg a day, Hydralazine 25 mg 3  times a day, isosorbide 30 mg a day, spironolactone 25 mg twice a day.  Our long-term goal will be to stop the amlodipine since it is not very helpful in the setting of systolic heart failure.  I actually think he can tolerate Entresto or an ARB.  His renal function is only minimally depressed.  Will decrease amlodipine to 5 mg a day  - anticipate stopping in the next day or so depending on how bp reacts   Start losartan 50 mg a day ,  Titrate up as tolerated, as needed Delene Loll would also be helpful if he can afford it  .   I have spent a total of 40 minutes with patient reviewing hospital  notes ,  telemetry, EKGs, labs and examining patient as well as establishing an assessment and plan that was discussed with the patient. > 50% of time was spent in direct patient care.    Vesta Mixer, Montez Hageman., MD, [facility] 08/19/2019, 9:03 AM [address]  [address] Office [phone] Pager [phone]

## 2019-08-20 LAB — BASIC METABOLIC PANEL
Anion gap: 8 (ref 5–15)
BUN: 16 mg/dL (ref 6–20)
CO2: 26 mmol/L (ref 22–32)
Calcium: 8.5 mg/dL — ABNORMAL LOW (ref 8.9–10.3)
Chloride: 105 mmol/L (ref 98–111)
Creatinine, Ser: 1.27 mg/dL — ABNORMAL HIGH (ref 0.61–1.24)
GFR calc Af Amer: >60 mL/min (ref >60)
GFR calc non Af Amer: >60 mL/min (ref >60)
Glucose, Bld: 158 mg/dL — ABNORMAL HIGH (ref 70–99)
Potassium: 3.4 mmol/L — ABNORMAL LOW (ref 3.5–5.1)
Sodium: 139 mmol/L (ref 135–145)

## 2019-08-20 LAB — GLUCOSE, CAPILLARY
Glucose-Capillary: 146 mg/dL — ABNORMAL HIGH (ref 70–99)
Glucose-Capillary: 105 mg/dL — ABNORMAL HIGH (ref 70–99)

## 2019-08-20 MED ORDER — NITROGLYCERIN 0.4 MG SL SUBL: 0.4000 mg | SUBLINGUAL_TABLET | SUBLINGUAL | 0 refills | Status: AC | PRN

## 2019-08-20 MED ORDER — ASPIRIN 81 MG PO TBEC: 81.0000 mg | DELAYED_RELEASE_TABLET | Freq: Every day | ORAL | Status: AC

## 2019-08-20 MED FILL — ?NITROGLYCERIN 0.4MG SUBLIN: 0.4 | 25 days supply | Qty: 25 | Fill #0

## 2019-08-20 NOTE — Progress Notes (Addendum)
Progress Note  Patient Name: Dustin Nicholson Date of Encounter: 08/20/2019  Primary Cardiologist: Dr. Jacques Navy  Subjective   49 yo with hx of CP, acute on chronic diastolic CHF, Morbid obesity  Denies chest pain this AM. EF improved on echo.  Echo from yesterday shows EF 50-55%./ Mild conc. LVH,   Inpatient Medications    Scheduled Meds: . amLODipine  10 mg Oral Daily  . aspirin EC  81 mg Oral Daily  . atorvastatin  20 mg Oral Daily  . carvedilol  25 mg Oral BID WC  . heparin  5,000 Units Subcutaneous Q8H  . hydrALAZINE  25 mg Oral Q8H  . insulin aspart  0-15 Units Subcutaneous TID WC  . isosorbide mononitrate  30 mg Oral Daily  . sodium chloride flush  3 mL Intravenous Once  . spironolactone  25 mg Oral BID   Continuous Infusions:  PRN Meds: acetaminophen, nitroGLYCERIN, nitroGLYCERIN, ondansetron (ZOFRAN) IV   Vital Signs    Vitals:   08/19/19 1709 08/20/19 0028 08/20/19 0645 08/20/19 0929  BP: 123/77 115/84 128/89 119/81  Pulse: 75 71 75 70  Resp: 16 16 16    Temp: 97.9 F (36.6 C) 98.2 F (36.8 C) 98.4 F (36.9 C)   TempSrc: Oral Oral Oral   SpO2: 96% 93% 96%   Weight:      Height:        Intake/Output Summary (Last 24 hours) at 08/20/2019 1037 Last data filed at 08/19/2019 1959 Gross per 24 hour  Intake --  Output 1300 ml  Net -1300 ml   Filed Weights   08/18/19 1021 08/19/19 1100  Weight: 132 kg 126.2 kg    Physical Exam   General: Overweight, NAD Neck: Negative for carotid bruits. No JVD Lungs:Clear to ausculation bilaterally. No wheezes, rales, or rhonchi. Breathing is unlabored. Cardiovascular: RRR with S1 S2. No murmurs Abdomen: Soft, non-tender, non-distended. No obvious abdominal masses. Extremities: No edema. Radial pulses 2+ bilaterally Neuro: Alert and oriented. No focal deficits. No facial asymmetry. MAE spontaneously. Psych: Responds to questions appropriately with normal affect.    Labs    Chemistry Recent Labs  Lab  08/18/19 1139 08/19/19 0658  NA 143 141  K 3.7 3.5  CL 108 103  CO2 23 24  GLUCOSE 134* 128*  BUN 15 14  CREATININE 1.28* 1.20  CALCIUM 9.0 8.5*  GFRNONAA >60 >60  GFRAA >60 >60  ANIONGAP 12 14     Hematology Recent Labs  Lab 08/18/19 1139 08/18/19 2200 08/19/19 0658  WBC 7.3 7.4 5.9  RBC 5.27 5.23 5.44  HGB 14.4 14.3 14.6  HCT 45.8 44.1 47.1  MCV 86.9 84.3 86.6  MCH 27.3 27.3 26.8  MCHC 31.4 32.4 31.0  RDW 14.4 15.2 14.6  PLT 231 194 212    Cardiac EnzymesNo results for input(s): TROPONINI in the last 168 hours. No results for input(s): TROPIPOC in the last 168 hours.   BNP Recent Labs  Lab 08/18/19 2000  BNP 47.5     DDimer No results for input(s): DDIMER in the last 168 hours.   Radiology    DG Chest 2 View  Result Date: 08/18/2019 CLINICAL DATA:  Chest pain EXAM: CHEST - 2 VIEW COMPARISON:  11/25/2018 FINDINGS: Cardiomegaly. No confluent opacities, effusions or edema. No acute bony abnormality. IMPRESSION: Cardiomegaly.  No active disease. Electronically Signed   By: 01/25/2019 M.D.   On: 08/18/2019 11:23   ECHOCARDIOGRAM COMPLETE  Result Date: 08/19/2019  ECHOCARDIOGRAM REPORT   Patient Name:   Dustin Nicholson Date of Exam: 08/19/2019 Medical Rec #:  [id number]   Height:       71.0 in Accession #:    [id number]  Weight:       278.3 lb Date of Birth:  1970-08-21   BSA:          2.426 m Patient Age:    86 years    BP:           123/77 mmHg Patient Gender: M           HR:           65 bpm. Exam Location:  Inpatient Procedure: 2D Echo and Intracardiac Opacification Agent Indications:    nonischemic cardiomyopathy  History:        Patient has prior history of Echocardiogram examinations, most                 recent 06/02/2018. Signs/Symptoms:Chest Pain; Risk                 Factors:Hypertension and Diabetes.  Sonographer:    Johny Chess Referring Phys: [id number] [place]  1. Left ventricular ejection fraction, by estimation, is 50 to 55%. The  left ventricle has low normal function. The left ventricle has no regional wall motion abnormalities. The left ventricular internal cavity size was mildly dilated. There is mild concentric left ventricular hypertrophy. Left ventricular diastolic parameters are consistent with Grade I diastolic dysfunction (impaired relaxation).  2. Right ventricular systolic function is normal. The right ventricular size is normal.  3. Left atrial size was mildly dilated.  4. The mitral valve is normal in structure. Trivial mitral valve regurgitation. No evidence of mitral stenosis.  5. The aortic valve is normal in structure. Aortic valve regurgitation is not visualized. No aortic stenosis is present.  6. There is mild dilatation of the ascending aorta measuring 39 mm.  7. The inferior vena cava is normal in size with greater than 50% respiratory variability, suggesting right atrial pressure of 3 mmHg. FINDINGS  Left Ventricle: Left ventricular ejection fraction, by estimation, is 50 to 55%. The left ventricle has low normal function. The left ventricle has no regional wall motion abnormalities. Definity contrast agent was given IV to delineate the left ventricular endocardial borders. The left ventricular internal cavity size was mildly dilated. There is mild concentric left ventricular hypertrophy. Left ventricular diastolic parameters are consistent with Grade I diastolic dysfunction (impaired relaxation). Indeterminate filling pressures. Right Ventricle: The right ventricular size is normal. No increase in right ventricular wall thickness. Right ventricular systolic function is normal. Left Atrium: Left atrial size was mildly dilated. Right Atrium: Right atrial size was normal in size. Pericardium: There is no evidence of pericardial effusion. Mitral Valve: The mitral valve is normal in structure. Normal mobility of the mitral valve leaflets. Trivial mitral valve regurgitation. No evidence of mitral valve stenosis. Tricuspid  Valve: The tricuspid valve is normal in structure. Tricuspid valve regurgitation is not demonstrated. No evidence of tricuspid stenosis. Aortic Valve: The aortic valve is normal in structure. Aortic valve regurgitation is not visualized. No aortic stenosis is present. Pulmonic Valve: The pulmonic valve was normal in structure. Pulmonic valve regurgitation is not visualized. No evidence of pulmonic stenosis. Aorta: The aortic root is normal in size and structure. There is mild dilatation of the ascending aorta measuring 39 mm. Venous: The inferior vena cava is normal in size with greater than 50% respiratory variability,  suggesting right atrial pressure of 3 mmHg. IAS/Shunts: No atrial level shunt detected by color flow Doppler.  LEFT VENTRICLE PLAX 2D LVIDd:         5.70 cm  Diastology LVIDs:         4.10 cm  LV e' lateral:   5.77 cm/s LV PW:         1.20 cm  LV E/e' lateral: 7.1 LV IVS:        1.20 cm  LV e' medial:    4.24 cm/s LVOT diam:     2.50 cm  LV E/e' medial:  9.6 LV SV:         72 LV SV Index:   30 LVOT Area:     4.91 cm  RIGHT VENTRICLE RV S prime:     14.80 cm/s TAPSE (M-mode): 1.8 cm LEFT ATRIUM             Index       RIGHT ATRIUM           Index LA diam:        5.10 cm 2.10 cm/m  RA Area:     16.50 cm LA Vol (A2C):   69.8 ml 28.77 ml/m RA Volume:   41.10 ml  16.94 ml/m LA Vol (A4C):   70.5 ml 29.06 ml/m LA Biplane Vol: 73.5 ml 30.29 ml/m  AORTIC VALVE LVOT Vmax:   82.20 cm/s LVOT Vmean:  56.600 cm/s LVOT VTI:    0.146 m  AORTA Ao Root diam: 3.60 cm Ao Asc diam:  3.90 cm MITRAL VALVE MV Area (PHT): 2.73 cm    SHUNTS MV Decel Time: 278 msec    Systemic VTI:  0.15 m MV E velocity: 40.70 cm/s  Systemic Diam: 2.50 cm MV A velocity: 61.30 cm/s MV E/A ratio:  0.66 Chilton Si MD Electronically signed by Chilton Si MD Signature Date/Time: 08/19/2019/6:02:01 PM    Final    Telemetry    08/20/19 NSR- Personally Reviewed  ECG    No new tracings as of 08/20/19 - Personally  Reviewed  Cardiac Studies   Obtaining outside records  Patient Profile     49 y.o. male with a hx of nonischemic cardiomyopathy with worsening shortness of breath, hypertension, dyslipidemia, obesity, and DM2 who presented to [facility] on 08/18/19 with worsening SOB and chest pain.   Assessment & Plan    1. Chest pain: -Presented with a several day hx of worsening SOB and chest pain -Recently seen by Dr. Jacques Navy to establish care with plans to pursue repeat echocardiogram given known hx of non-ischemic cardiomyopathy.  -Currently obtaining outside records.  -Previous plan was for coronary CTA however CCTA from outside facility with no acute abnormalities per Dr. Jacques Navy. -Denies chest pain today>>consider other CP etiologies   2. Acute on chronic diastolic heart failure: -Progressive SOB and LE edema>>recently started on OP Lasix  -Lasix transitioned to IV for diuresis  -Weight, 278lb today   -I&O, net neg 1.3L -Continue carvedilol 25 mg twice daily, IV Lasix, sprio,  -Echo found to have an LVEF at 50-55% with LVH and G1DD -Prior echo from 06/03/19 with EF at 35-40% -Checking BMET   3. Hypertension: -Stable, 119/81>128/89>115/84 -Continue spironolactone>>recent dose titration at last OV to 50 PO BID -Continue amlodipine 10 mg daily, carvedilol 25 mg twice daily, Imdur 30 mg daily -Home hydralazine 25 mg 3 times daily and spiro 50 currently on hold    4. CAD with prior MI per patient report: -Reports  a history of MI, but has had a prior coronary angiogram as was a cardiac CTA.  -Unclear history of CAD>>obtaining outside records   -Continue aspirin 81, statin and BB  5. History of possible LV thrombus by patient report -Echo with improved EF at 50-55% with G1DD -Previously noted to be on Coumadin   6. DM2: -Last HbA1c, 7.8 on 05/06/19 -SSI for glucose control while inpatient status   Signed, Georgie Chard NP-C [facility] Pager: [phone] 08/20/2019, 10:37 AM     For  questions or updates, please contact   Please consult [email or web address] for contact info under Cardiology/STEMI.  Attending Note:   The patient was seen and examined.  Agree with assessment and plan as noted above.  Changes made to the above note as needed.  Patient seen and independently examined with  Georgie Chard , NP .   We discussed all aspects of the encounter. I agree with the assessment and plan as stated above.  1.   Chest pain :   trops are minimally elevated.   Not c/w acs Cor CT angio from 2018 looked ok  2.  Acute on chronic combined CHF:  EF has improved.   Cont current meds  Advised him to avoid salty foods.   Follow up with Dr. Jacques Navy or APP in several weeks.    I have spent a total of 40 minutes with patient reviewing hospital  notes , telemetry, EKGs, labs and examining patient as well as establishing an assessment and plan that was discussed with the patient. > 50% of time was spent in direct patient care.    Vesta Mixer, Montez Hageman., MD, [facility] 08/20/2019, 11:29 AM [address]  [address] Office [phone] Pager [phone]

## 2019-08-20 NOTE — Progress Notes (Signed)
Nsg Discharge Note   Patient to be discharged home. Awaiting transportation. Discharge information provided to patient and questions answered.   Admit Date:  08/18/2019 Discharge date: 08/20/2019   Dustin Nicholson to be D/C'd Home per MD order.  AVS completed.  Copy for chart, and copy for patient signed, and dated. Patient/caregiver able to verbalize understanding.  Discharge Medication: Allergies as of 08/20/2019      Reactions   Shellfish Allergy Anaphylaxis      Medication List    TAKE these medications   amLODipine 10 MG tablet Commonly known as: NORVASC Take 1 tablet (10 mg total) by mouth daily.   aspirin 81 MG EC tablet Take 1 tablet (81 mg total) by mouth daily. Start taking on: Aug 21, 2019   atorvastatin 20 MG tablet Commonly known as: LIPITOR Take 1 tablet (20 mg total) by mouth daily.   carvedilol 25 MG tablet Commonly known as: COREG Take 1 tablet (25 mg total) by mouth 2 (two) times daily with a meal.   fluticasone 50 MCG/ACT nasal spray Commonly known as: FLONASE Place 2 sprays into both nostrils daily.   furosemide 40 MG tablet Commonly known as: LASIX Take 2 pills twice per day for 3 days then 40 mg in am and 1/2 pill (20 mg ) in early evening What changed:   how much to take  how to take this  when to take this  additional instructions   gabapentin 300 MG capsule Commonly known as: NEURONTIN Take 1 capsule (300 mg total) by mouth at bedtime.   hydrALAZINE 25 MG tablet Commonly known as: APRESOLINE Take 1 tablet (25 mg total) by mouth 3 (three) times daily.   isosorbide mononitrate 30 MG 24 hr tablet Commonly known as: IMDUR Take 1 tablet (30 mg total) by mouth daily.   metFORMIN 500 MG tablet Commonly known as: GLUCOPHAGE Take 2 tablets (1,000 mg total) by mouth 2 (two) times daily with a meal.   nitroGLYCERIN 0.4 MG SL tablet Commonly known as: NITROSTAT Place 1 tablet (0.4 mg total) under the tongue every 5 (five) minutes x 3 doses  as needed for chest pain.   spironolactone 25 MG tablet Commonly known as: ALDACTONE Take 25 mg twice a day What changed:   how much to take  how to take this  when to take this  additional instructions   True Metrix Blood Glucose Test test strip Generic drug: glucose blood Use as instructed What changed:   how much to take  how to take this  when to take this  additional instructions   True Metrix Meter w/Device Kit Use to check BS up to 3 times per day What changed:   how much to take  how to take this  when to take this   TRUEplus Lancets 28G Misc Use when checking blood sugars What changed:   how much to take  how to take this  when to take this  additional instructions   Vitamin D (Ergocalciferol) 1.25 MG (50000 UNIT) Caps capsule Commonly known as: DRISDOL Take 1 capsule (50,000 Units total) by mouth every 7 (seven) days.       Discharge Assessment: Vitals:   08/20/19 0645 08/20/19 0929  BP: 128/89 119/81  Pulse: 75 70  Resp: 16   Temp: 98.4 F (36.9 C)   SpO2: 96%    Skin clean, dry and intact without evidence of skin break down, no evidence of skin tears noted. IV catheter discontinued intact. Site without  signs and symptoms of complications - no redness or edema noted at insertion site, patient denies c/o pain - only slight tenderness at site.  Dressing with slight pressure applied.  D/c Instructions-Education: Discharge instructions given to patient/family with verbalized understanding. D/c education completed with patient/family including follow up instructions, medication list, d/c activities limitations if indicated, with other d/c instructions as indicated by MD - patient able to verbalize understanding, all questions fully answered. Patient instructed to return to ED, call 911, or call MD for any changes in condition.  Patient escorted via [place], and D/C home via private auto.  Erasmo Leventhal, RN 08/20/2019 1:28 PM

## 2019-08-20 NOTE — Discharge Summary (Addendum)
Discharge Summary    Patient ID: Dustin Nicholson MRN: [id number]; DOB: 11/08/1970  Admit date: 08/18/2019 Discharge date: 08/20/2019  Primary Care Provider: Antony Blackbird, MD  Primary Cardiologist: Dustin Munroe, MD   Discharge Diagnoses    Active Problems:   Chest pain  Diagnostic Studies/Procedures    None    History of Present Illness     Dustin Nicholson is a 49 y.o. male with with a hx of nonischemic cardiomyopathy with worsening shortness of breath, hypertension, dyslipidemia, obesity, and DM2 who presented to [facility] on 08/18/19 with worsening SOB and chest pain.   Hospital Course     Patient presented with worsening SOB and chest pain for the last several days. States his pain had been continual and he could not determine exacerbating or relieving factors. Did report associated SOB and possible diaphoresis. Recently seen by Dustin Nicholson to establish care with plans to pursue repeat echocardiogram given known hx of non-ischemic cardiomyopathy. Unable to obtain records at this time (from [employer] and office in [place]). On ED arrival hsT, 25>26>17>26 with no definitive ischemic changes on EKG. Initial plan was for CCTA however in speaking with Dustin Nicholson, who personally reviewed prior study with no significant CAD noted, CCTA was cancelled.   He had no recurrent pain on day of discharge with plans to follow up in office with Dustin Nicholson or APP in several weeks. Echocardiogram did show improvement in LV function, now up to 50-55% with G1DD.   Hospital issues:  Chest pain: -Presented with a several day hx of worsening SOB and chest pain -Recently seen by Dustin Nicholson to establish care with plans to pursue repeat echocardiogram given known hx of non-ischemic cardiomyopathy.  -Currently obtaining outside records.  -Previous plan was for coronary CTA however CCTA from outside facility with no acute abnormalities per Dustin Nicholson. -Denies chest pain today>>consider other CP etiologies   Acute  on chronic diastolic heart failure: -Progressive SOB and LE edema>>recently started on OP Lasix  -Lasix transitioned to IV for diuresis  -Weight, 278lb today  -I&O, net neg 1.3L -Continuecarvedilol 25 mg twice daily, IV Lasix, sprio,  -Echo found to have an LVEF at 50-55% with LVH and G1DD -Prior echo from 06/03/19 with EF at 35-40%  Hypertension: -Stable, 119/81>128/89>115/84 -Continue spironolactone>>recent dose titration at last OV to 50 PO BID -Continue amlodipine 10 mg daily, carvedilol 25 mg twice daily, Imdur 30 mg daily -Homehydralazine 25 mg 3 times dailyand spiro 50 currently on hold  CAD with prior MI per patient report: -Reports a history of MI, but has had a prior coronary angiogram as was a cardiac CTA. -Unclear history of CAD>>obtaining outside records  -Continue aspirin 81, statin and BB  History of possible LV thrombus by patient report -Echo with improved EF at 50-55% with G1DD -Previously noted to be on Coumadin   DM2: -Last HbA1c, 7.8 on 05/06/19 -SSI for glucose control while inpatient status  Consultants: None   The patient was seen and examined by Dustin Nicholson who feels that he is stable and ready for discharge today, 08/20/19  Did the patient have an acute coronary syndrome (MI, NSTEMI, STEMI, etc) this admission?:  No                               Did the patient have a percutaneous coronary intervention (stent / angioplasty)?:  No.   _____________  Discharge Vitals Blood pressure 119/81, pulse 70, temperature 98.4  F (36.9 C), temperature source Oral, resp. rate 16, height _0  (1.803 m), weight 126.2 kg, SpO2 96 %.  Filed Weights   08/18/19 1021 08/19/19 1100  Weight: 132 kg 126.2 kg    Labs & Radiologic Studies    CBC Recent Labs    08/18/19 2200 08/19/19 0658  WBC 7.4 5.9  HGB 14.3 14.6  HCT 44.1 47.1  MCV 84.3 86.6  PLT 194 109   Basic Metabolic Panel Recent Labs    08/18/19 1139 08/19/19 0658  NA 143 141  K 3.7  3.5  CL 108 103  CO2 23 24  GLUCOSE 134* 128*  BUN 15 14  CREATININE 1.28* 1.20  CALCIUM 9.0 8.5*   Liver Function Tests No results for input(s): AST, ALT, ALKPHOS, BILITOT, PROT, ALBUMIN in the last 72 hours. No results for input(s): LIPASE, AMYLASE in the last 72 hours. High Sensitivity Troponin:   Recent Labs  Lab 08/18/19 1139 08/18/19 2000 08/18/19 2200 08/19/19 0037  TROPONINIHS 25* 26* 17 26*    BNP Invalid input(s): POCBNP D-Dimer No results for input(s): DDIMER in the last 72 hours. Hemoglobin A1C Recent Labs    08/19/19 0658  HGBA1C 7.9*   Fasting Lipid Panel Recent Labs    08/19/19 0658  CHOL 177  HDL 31*  LDLCALC 120*  TRIG 129  CHOLHDL 5.7   Thyroid Function Tests No results for input(s): TSH, T4TOTAL, T3FREE, THYROIDAB in the last 72 hours.  Invalid input(s): FREET3 _____________  DG Chest 2 View  Result Date: 08/18/2019 CLINICAL DATA:  Chest pain EXAM: CHEST - 2 VIEW COMPARISON:  11/25/2018 FINDINGS: Cardiomegaly. No confluent opacities, effusions or edema. No acute bony abnormality. IMPRESSION: Cardiomegaly.  No active disease. Electronically Signed   By: Rolm Baptise M.D.   On: 08/18/2019 11:23   ECHOCARDIOGRAM COMPLETE  Result Date: 08/19/2019    ECHOCARDIOGRAM REPORT   Patient Name:   Dustin Nicholson Date of Exam: 08/19/2019 Medical Rec #:  [id number]   Height:       71.0 in Accession #:    [id number]  Weight:       278.3 lb Date of Birth:  1970/08/02   BSA:          2.426 m Patient Age:    88 years    BP:           123/77 mmHg Patient Gender: M           HR:           65 bpm. Exam Location:  Inpatient Procedure: 2D Echo and Intracardiac Opacification Agent Indications:    nonischemic cardiomyopathy  History:        Patient has prior history of Echocardiogram examinations, most                 recent 06/02/2018. Signs/Symptoms:Chest Pain; Risk                 Factors:Hypertension and Diabetes.  Sonographer:    Dustin Nicholson Referring Phys: [id number]  Dustin Nicholson  1. Left ventricular ejection fraction, by estimation, is 50 to 55%. The left ventricle has low normal function. The left ventricle has no regional wall motion abnormalities. The left ventricular internal cavity size was mildly dilated. There is mild concentric left ventricular hypertrophy. Left ventricular diastolic parameters are consistent with Grade I diastolic dysfunction (impaired relaxation).  2. Right ventricular systolic function is normal. The right ventricular size is normal.  3. Left atrial size  was mildly dilated.  4. The mitral valve is normal in structure. Trivial mitral valve regurgitation. No evidence of mitral stenosis.  5. The aortic valve is normal in structure. Aortic valve regurgitation is not visualized. No aortic stenosis is present.  6. There is mild dilatation of the ascending aorta measuring 39 mm.  7. The inferior vena cava is normal in size with greater than 50% respiratory variability, suggesting right atrial pressure of 3 mmHg. FINDINGS  Left Ventricle: Left ventricular ejection fraction, by estimation, is 50 to 55%. The left ventricle has low normal function. The left ventricle has no regional wall motion abnormalities. Definity contrast agent was given IV to delineate the left ventricular endocardial borders. The left ventricular internal cavity size was mildly dilated. There is mild concentric left ventricular hypertrophy. Left ventricular diastolic parameters are consistent with Grade I diastolic dysfunction (impaired relaxation). Indeterminate filling pressures. Right Ventricle: The right ventricular size is normal. No increase in right ventricular wall thickness. Right ventricular systolic function is normal. Left Atrium: Left atrial size was mildly dilated. Right Atrium: Right atrial size was normal in size. Pericardium: There is no evidence of pericardial effusion. Mitral Valve: The mitral valve is normal in structure. Normal mobility of the mitral  valve leaflets. Trivial mitral valve regurgitation. No evidence of mitral valve stenosis. Tricuspid Valve: The tricuspid valve is normal in structure. Tricuspid valve regurgitation is not demonstrated. No evidence of tricuspid stenosis. Aortic Valve: The aortic valve is normal in structure. Aortic valve regurgitation is not visualized. No aortic stenosis is present. Pulmonic Valve: The pulmonic valve was normal in structure. Pulmonic valve regurgitation is not visualized. No evidence of pulmonic stenosis. Aorta: The aortic root is normal in size and structure. There is mild dilatation of the ascending aorta measuring 39 mm. Venous: The inferior vena cava is normal in size with greater than 50% respiratory variability, suggesting right atrial pressure of 3 mmHg. IAS/Shunts: No atrial level shunt detected by color flow Doppler.  LEFT VENTRICLE PLAX 2D LVIDd:         5.70 cm  Diastology LVIDs:         4.10 cm  LV e' lateral:   5.77 cm/s LV PW:         1.20 cm  LV E/e' lateral: 7.1 LV IVS:        1.20 cm  LV e' medial:    4.24 cm/s LVOT diam:     2.50 cm  LV E/e' medial:  9.6 LV SV:         72 LV SV Index:   30 LVOT Area:     4.91 cm  RIGHT VENTRICLE RV S prime:     14.80 cm/s TAPSE (M-mode): 1.8 cm LEFT ATRIUM             Index       RIGHT ATRIUM           Index LA diam:        5.10 cm 2.10 cm/m  RA Area:     16.50 cm LA Vol (A2C):   69.8 ml 28.77 ml/m RA Volume:   41.10 ml  16.94 ml/m LA Vol (A4C):   70.5 ml 29.06 ml/m LA Biplane Vol: 73.5 ml 30.29 ml/m  AORTIC VALVE LVOT Vmax:   82.20 cm/s LVOT Vmean:  56.600 cm/s LVOT VTI:    0.146 m  AORTA Ao Root diam: 3.60 cm Ao Asc diam:  3.90 cm MITRAL VALVE MV Area (PHT): 2.73 cm  SHUNTS MV Decel Time: 278 msec    Systemic VTI:  0.15 m MV E velocity: 40.70 cm/s  Systemic Diam: 2.50 cm MV A velocity: 61.30 cm/s MV E/A ratio:  0.66 Skeet Latch MD Electronically signed by Skeet Latch MD Signature Date/Time: 08/19/2019/6:02:01 PM    Final    Disposition   Pt  is being discharged home today in good condition.  Follow-up Plans & Appointments   Follow-up Information    Dustin Munroe, MD Follow up on 09/21/2019.   Specialties: Cardiology, Radiology Why: at 0820am.  Contact information: [address] [place] [address] [phone]          Discharge Instructions    Call MD for:  difficulty breathing, headache or visual disturbances   Complete by: As directed    Call MD for:  extreme fatigue   Complete by: As directed    Call MD for:  hives   Complete by: As directed    Call MD for:  persistant dizziness or light-headedness   Complete by: As directed    Call MD for:  persistant nausea and vomiting   Complete by: As directed    Call MD for:  redness, tenderness, or signs of infection (pain, swelling, redness, odor or green/yellow discharge around incision site)   Complete by: As directed    Call MD for:  severe uncontrolled pain   Complete by: As directed    Call MD for:  temperature >100.4   Complete by: As directed    Diet - low sodium heart healthy   Complete by: As directed    Increase activity slowly   Complete by: As directed       Discharge Medications   Allergies as of 08/20/2019      Reactions   Shellfish Allergy Anaphylaxis      Medication List    TAKE these medications   amLODipine 10 MG tablet Commonly known as: NORVASC Take 1 tablet (10 mg total) by mouth daily.   aspirin 81 MG EC tablet Take 1 tablet (81 mg total) by mouth daily. Start taking on: Aug 21, 2019   atorvastatin 20 MG tablet Commonly known as: LIPITOR Take 1 tablet (20 mg total) by mouth daily.   carvedilol 25 MG tablet Commonly known as: COREG Take 1 tablet (25 mg total) by mouth 2 (two) times daily with a meal.   fluticasone 50 MCG/ACT nasal spray Commonly known as: FLONASE Place 2 sprays into both nostrils daily.   furosemide 40 MG tablet Commonly known as: LASIX Take 2 pills twice per day for 3 days then 40  mg in am and 1/2 pill (20 mg ) in early evening What changed:   how much to take  how to take this  when to take this  additional instructions   gabapentin 300 MG capsule Commonly known as: NEURONTIN Take 1 capsule (300 mg total) by mouth at bedtime.   hydrALAZINE 25 MG tablet Commonly known as: APRESOLINE Take 1 tablet (25 mg total) by mouth 3 (three) times daily.   isosorbide mononitrate 30 MG 24 hr tablet Commonly known as: IMDUR Take 1 tablet (30 mg total) by mouth daily.   metFORMIN 500 MG tablet Commonly known as: GLUCOPHAGE Take 2 tablets (1,000 mg total) by mouth 2 (two) times daily with a meal.   nitroGLYCERIN 0.4 MG SL tablet Commonly known as: NITROSTAT Place 1 tablet (0.4 mg total) under the tongue every 5 (five) minutes x 3 doses as  needed for chest pain.   spironolactone 25 MG tablet Commonly known as: ALDACTONE Take 25 mg twice a day What changed:   how much to take  how to take this  when to take this  additional instructions   True Metrix Blood Glucose Test test strip Generic drug: glucose blood Use as instructed What changed:   how much to take  how to take this  when to take this  additional instructions   True Metrix Meter w/Device Kit Use to check BS up to 3 times per day What changed:   how much to take  how to take this  when to take this   TRUEplus Lancets 28G Misc Use when checking blood sugars What changed:   how much to take  how to take this  when to take this  additional instructions   Vitamin D (Ergocalciferol) 1.25 MG (50000 UNIT) Caps capsule Commonly known as: DRISDOL Take 1 capsule (50,000 Units total) by mouth every 7 (seven) days.       Outstanding Labs/Studies   None   Duration of Discharge Encounter   Greater than 30 minutes including physician time.  Signed, Kathyrn Drown, NP 08/20/2019, 11:55 AM  Attending Note:   The patient was seen and examined.  Agree with assessment and plan  as noted above.  Changes made to the above note as needed.  Patient seen and independently examined with  Kathyrn Drown , NP .   We discussed all aspects of the encounter. I agree with the assessment and plan as stated above.  1.   Chest pain :   trops are minimally elevated.   Not c/w acs Cor CT angio from 2018 looked ok  2.  Acute on chronic combined CHF:  EF has improved.   Cont current meds  Advised him to avoid salty foods.   Follow up with Dustin Nicholson or APP in several weeks.     Mertie Moores, MD  08/20/2019 3:35 PM    [facility] [address],  [place], [place]  [address] Phone: [phone]; Fax: [phone]

## 2019-08-23 ENCOUNTER — Telehealth: Payer: Self-pay

## 2019-08-23 NOTE — Telephone Encounter (Signed)
Transition Care Management Follow-up Telephone Call Date of discharge and from where: 08/20/2019, [facility]  Call placed to patient # [phone], voicemail not set up , unable to leave a message. Call placed to # [phone].  The message stated that the call is not able to be completed at this time   He has an appointment with Dr Jillyn Hidden - 08/27/2019 @ 1330

## 2019-08-24 ENCOUNTER — Telehealth: Payer: Self-pay

## 2019-08-24 NOTE — Telephone Encounter (Signed)
Transition Care Management Follow-up Telephone Call Attempt # 2   Date of discharge and from where: 08/20/2019, [facility]  Call placed to patient # [phone], voicemail not set up , unable to leave a message. Call placed to # [phone].  The message stated that the call is not able to be completed at this time   He has an appointment with Dr Jillyn Hidden - 08/27/2019 @ 1330

## 2019-08-27 ENCOUNTER — Encounter: Payer: Self-pay | Admitting: Family Medicine

## 2019-08-27 ENCOUNTER — Other Ambulatory Visit: Payer: Self-pay

## 2019-08-27 ENCOUNTER — Ambulatory Visit: Payer: Self-pay | Attending: Family Medicine | Admitting: Family Medicine

## 2019-08-27 VITALS — BP 136/83 | HR 62 | Temp 97.9°F | Ht 71.0 in | Wt 290.8 lb

## 2019-08-27 DIAGNOSIS — Z79899 Other long term (current) drug therapy: Secondary | ICD-10-CM

## 2019-08-27 DIAGNOSIS — M79605 Pain in left leg: Secondary | ICD-10-CM

## 2019-08-27 DIAGNOSIS — H1013 Acute atopic conjunctivitis, bilateral: Secondary | ICD-10-CM

## 2019-08-27 DIAGNOSIS — Z789 Other specified health status: Secondary | ICD-10-CM

## 2019-08-27 DIAGNOSIS — I10 Essential (primary) hypertension: Secondary | ICD-10-CM

## 2019-08-27 DIAGNOSIS — M79604 Pain in right leg: Secondary | ICD-10-CM

## 2019-08-27 DIAGNOSIS — E1169 Type 2 diabetes mellitus with other specified complication: Secondary | ICD-10-CM

## 2019-08-27 DIAGNOSIS — Z09 Encounter for follow-up examination after completed treatment for conditions other than malignant neoplasm: Secondary | ICD-10-CM

## 2019-08-27 DIAGNOSIS — I5042 Chronic combined systolic (congestive) and diastolic (congestive) heart failure: Secondary | ICD-10-CM

## 2019-08-27 DIAGNOSIS — J309 Allergic rhinitis, unspecified: Secondary | ICD-10-CM

## 2019-08-27 DIAGNOSIS — E785 Hyperlipidemia, unspecified: Secondary | ICD-10-CM

## 2019-08-27 DIAGNOSIS — E119 Type 2 diabetes mellitus without complications: Secondary | ICD-10-CM

## 2019-08-27 MED ORDER — LORATADINE 10 MG PO TABS: 10.0000 mg | ORAL_TABLET | Freq: Every day | ORAL | 3 refills | Status: AC

## 2019-08-27 MED ORDER — FUROSEMIDE 40 MG PO TABS: ORAL_TABLET | ORAL | 4 refills | Status: AC

## 2019-08-27 MED FILL — METFORMIN HCL 500 MG TABS: 500 | 30 days supply | Qty: 120 | Fill #1

## 2019-08-27 MED FILL — ?ERGOCALCIFEROL 50000 UNITC: 1.25 MG | 28 days supply | Qty: 4 | Fill #1

## 2019-08-27 MED FILL — AMLODIPINE BESYLATE 10 MG T: 10 | 30 days supply | Qty: 30 | Fill #3

## 2019-08-27 MED FILL — GABAPENTIN 300 MG CAPSULE: 300 | 30 days supply | Qty: 30 | Fill #1

## 2019-08-27 MED FILL — ISOSORBIDE MN ER 30 MG TAB: 30 | 30 days supply | Qty: 30 | Fill #3

## 2019-08-27 MED FILL — ?CARVEDILOL 25 MG TABLET: 25 | 30 days supply | Qty: 60 | Fill #3

## 2019-08-27 MED FILL — ?ATORVASTATIN 20 MG TABLET: 20 | 30 days supply | Qty: 30 | Fill #3

## 2019-08-27 MED FILL — ?FUROSEMIDE 4OMG TABLETS: 40 | 35 days supply | Qty: 60 | Fill #2

## 2019-08-27 MED FILL — hydrALAZINE HCL 25 MG TABS: 25 | 30 days supply | Qty: 90 | Fill #1

## 2019-08-27 MED FILL — ?SPIRONOLACTONE 25 MG TABLE: 25 | 30 days supply | Qty: 30 | Fill #3

## 2019-08-27 NOTE — Progress Notes (Signed)
Established Patient Office Visit  Subjective:  Patient ID: Dustin Nicholson, male    DOB: 08/08/1970  Age: 49 y.o. MRN: [id number]  CC: hospital follow-up  HPI Dustin Nicholson, 49 year old male, who is seen status post hospitalization from 08/18/2019 through 08/20/2019 after presenting with worsening shortness of breath and chest pain.  He has a history of nonischemic cardiomyopathy, hypertension, dyslipidemia and type 2 diabetes.  He was found to have acute on chronic heart failure for which he received IV Lasix diuresis and he was found to have improvement in his LV EF at 50 to 55% on echocardiogram.  He is to continue carvedilol, spironolactone as well as amlodipine and prescribed Imdur 30 mg daily.  He is to continue 81 mg daily aspirin, statin therapy and beta-blocker therapy for history of CAD.  Last hemoglobin A1c was 7.8 on 05/06/2019 and during hospitalization hemoglobin A1c of 7.9 on 08/19/2019.        Per patient he feels better status post hospitalization- no chest pain since hospital discharge, less leg swelling and decreased shortness of breath. He has been complaint with his medications and has been checking his weight and has gained weight since hospital discharge. He still feels the need to prop him self on pillows when attempting to sleep as he cannot lie down on just one pillow. He denies any headaches or dizziness related to his blood pressure. He is taking his metformin and denies increased thirst. He denies any increased urinary frequency except for frequency associated with.  furosemide/lasix use. He is taking his cholesterol medication. He has been having issues with muscle aches in both legs. He did try stopping the cholesterol medication in the past but nothing really changed. He is not sure if his low back pain is causing his leg issues.   Past Medical History:  Diagnosis Date  . CHF (congestive heart failure) ([place])   . 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  Hypertension   . MI (myocardial infarction) ([place]) 2009/2011   Pt states he has had 2 MI's    History reviewed. No pertinent surgical history.  Family History  Problem Relation Age of Onset  . Hypertension Mother   . Hypertension Father     Social History   Socioeconomic History  . Marital status: Significant Other    Spouse name: Not on file  . Number of children: Not on file  . Years of education: Not on file  . Highest education level: Not on file  Occupational History  . Not on file  Tobacco Use  . Smoking status: Never Smoker  . Smokeless tobacco: Never Used  Substance and Sexual Activity  . Alcohol use: Yes    Comment: occ  . Drug use: No  . Sexual activity: Yes  Other Topics Concern  . Not on file  Social History Narrative  . Not on file   Social Determinants of Health   Financial Resource Strain:   . Difficulty of Paying Living Expenses:   Food Insecurity:   . Worried About Charity fundraiser in the Last Year:   . Arboriculturist in the Last Year:   Transportation Needs:   . Film/video editor (Medical):   Marland Kitchen Lack of Transportation (Non-Medical):   Physical Activity:   . Days of Exercise per Week:   . Minutes of Exercise per Session:   Stress:   . Feeling of Stress :   Social Connections:   . Frequency of  Communication with Friends and Family:   . Frequency of Social Gatherings with Friends and Family:   . Attends Religious Services:   . Active Member of Clubs or Organizations:   . Attends Archivist Meetings:   Marland Kitchen Marital Status:   Intimate Partner Violence:   . Fear of Current or Ex-Partner:   . Emotionally Abused:   Marland Kitchen Physically Abused:   . Sexually Abused:     Outpatient Medications Prior to Visit  Medication Sig Dispense Refill  . amLODipine (NORVASC) 10 MG tablet Take 1 tablet (10 mg total) by mouth daily. 30 tablet 6  . aspirin EC 81 MG EC tablet Take 1 tablet (81 mg total) by mouth daily.    Marland Kitchen atorvastatin (LIPITOR) 20  MG tablet Take 1 tablet (20 mg total) by mouth daily. 30 tablet 4  . Blood Glucose Monitoring Suppl (TRUE METRIX METER) w/Device KIT Use to check BS up to 3 times per day (Patient taking differently: 1 each by Other route in the morning, at noon, and at bedtime. Use to check BS up to 3 times per day) 1 kit 0  . carvedilol (COREG) 25 MG tablet Take 1 tablet (25 mg total) by mouth 2 (two) times daily with a meal. 60 tablet 4  . fluticasone (FLONASE) 50 MCG/ACT nasal spray Place 2 sprays into both nostrils daily. 16 g 6  . gabapentin (NEURONTIN) 300 MG capsule Take 1 capsule (300 mg total) by mouth at bedtime. 90 capsule 3  . glucose blood (TRUE METRIX BLOOD GLUCOSE TEST) test strip Use as instructed (Patient taking differently: 1 each by Other route in the morning, at noon, and at bedtime. ) 100 each 12  . hydrALAZINE (APRESOLINE) 25 MG tablet Take 1 tablet (25 mg total) by mouth 3 (three) times daily. 90 tablet 4  . isosorbide mononitrate (IMDUR) 30 MG 24 hr tablet Take 1 tablet (30 mg total) by mouth daily. 30 tablet 6  . metFORMIN (GLUCOPHAGE) 500 MG tablet Take 2 tablets (1,000 mg total) by mouth 2 (two) times daily with a meal. 120 tablet 3  . nitroGLYCERIN (NITROSTAT) 0.4 MG SL tablet Place 1 tablet (0.4 mg total) under the tongue every 5 (five) minutes x 3 doses as needed for chest pain. 25 tablet 0  . spironolactone (ALDACTONE) 25 MG tablet Take 25 mg twice a day (Patient taking differently: Take 50 mg by mouth once. ) 180 tablet 3  . TRUEplus Lancets 28G MISC Use when checking blood sugars (Patient taking differently: 1 each by Other route in the morning, at noon, and at bedtime. ) 100 each 6  . Vitamin D, Ergocalciferol, (DRISDOL) 1.25 MG (50000 UNIT) CAPS capsule Take 1 capsule (50,000 Units total) by mouth every 7 (seven) days. 16 capsule 0  . furosemide (LASIX) 40 MG tablet Take 2 pills twice per day for 3 days then 40 mg in am and 1/2 pill (20 mg ) in early evening (Patient taking  differently: Take 20 mg by mouth See admin instructions. Take 2 pills twice per day for 3 days then 40 mg in am and 1/2 pill (20 mg ) in early evening  First 3 days, 2 tablets in the morning, 1 tab at night. After the 3rd day, 1 tablet in the morning and 1/2 tablet at night.) 60 tablet 4   No facility-administered medications prior to visit.    Allergies  Allergen Reactions  . Shellfish Allergy Anaphylaxis    ROS Review of Systems  Constitutional:  Positive for fatigue. Negative for diaphoresis and fever.  HENT: Negative for sore throat and trouble swallowing.   Respiratory: Negative for cough and shortness of breath.   Cardiovascular: Positive for leg swelling (improved since hospitalization). Negative for chest pain and palpitations.  Gastrointestinal: Negative for abdominal pain, blood in stool, constipation, diarrhea and nausea.  Endocrine: Positive for polyuria (with lasix use). Negative for polydipsia and polyphagia.  Genitourinary: Positive for frequency (due to fluid pill). Negative for dysuria and flank pain.  Musculoskeletal: Positive for back pain (Chronic). Negative for arthralgias.  Skin:       No active skin breakdown on the feet  Neurological: Negative for dizziness and headaches.  Hematological: Negative for adenopathy. Does not bruise/bleed easily.      Objective:    Physical Exam  Constitutional: He is oriented to person, place, and time. He appears well-developed and well-nourished.  WNWD overweight for height/obese male in NAD wearing mask as per office COVID-19 protocol  Neck: No JVD present.  Cardiovascular: Normal rate, regular rhythm and intact distal pulses.  Mild bilateral lower extremity edema and mild pedal edema, trace pretibial edema  Pulmonary/Chest: Effort normal and breath sounds normal. He has no rales.  Abdominal: Soft. There is no abdominal tenderness. There is no rebound and no guarding.  Musculoskeletal:        General: Tenderness present.  No edema.     Cervical back: Neck supple.     Comments: Lumbosacral tenderness and bilateral lumbosacral paraspinous spasm  Lymphadenopathy:    He has no cervical adenopathy.  Neurological: He is alert and oriented to person, place, and time.  Skin: Skin is warm and dry. No erythema.  Psychiatric: He has a normal mood and affect. His behavior is normal.  Nursing note and vitals reviewed.   BP 136/83   Pulse 62   Temp 97.9 F (36.6 C) (Temporal)   Ht 5' 11" (1.803 m)   Wt 290 lb 12.8 oz (131.9 kg)   SpO2 96%   BMI 40.56 kg/m  Wt Readings from Last 3 Encounters:  08/27/19 290 lb 12.8 oz (131.9 kg)  08/19/19 278 lb 4.8 oz (126.2 kg)  08/13/19 293 lb 6.4 oz (133.1 kg)     Health Maintenance Due  Topic Date Due  . FOOT EXAM  Never done  . OPHTHALMOLOGY EXAM  Never done  . URINE MICROALBUMIN  Never done  . COVID-19 Vaccine (1) Never done    Lab Results  Component Value Date   TSH 2.350 08/27/2019   Lab Results  Component Value Date   WBC 5.9 08/19/2019   HGB 14.6 08/19/2019   HCT 47.1 08/19/2019   MCV 86.6 08/19/2019   PLT 212 08/19/2019   Lab Results  Component Value Date   NA 139 08/27/2019   K 4.3 08/27/2019   CO2 21 08/27/2019   GLUCOSE 115 (H) 08/27/2019   BUN 13 08/27/2019   CREATININE 1.09 08/27/2019   BILITOT 0.6 07/28/2019   ALKPHOS 83 07/28/2019   AST 13 07/28/2019   ALT 18 07/28/2019   PROT 6.4 07/28/2019   ALBUMIN 4.0 07/28/2019   CALCIUM 8.8 08/27/2019   ANIONGAP 8 08/20/2019   Lab Results  Component Value Date   CHOL 177 08/19/2019   Lab Results  Component Value Date   HDL 31 (L) 08/19/2019   Lab Results  Component Value Date   LDLCALC 120 (H) 08/19/2019   Lab Results  Component Value Date   TRIG 129 08/19/2019   Lab  Results  Component Value Date   CHOLHDL 5.7 08/19/2019   Lab Results  Component Value Date   HGBA1C 7.9 (H) 08/19/2019      Assessment & Plan:  1. Hospital discharge follow-up 2. Chronic combined systolic  and diastolic heart failure ([place]) Patient is status post recent hospitalization from 08/18/2019 through 08/20/2019 due to acute on chronic CHF exacerbation, ischemic cardiomyopathy after presenting with shortness of breath, worsening lower extremity edema and chest pain. He was treated with IV Lasix for diaphoresis with weight of 278 on day of discharge.  He was prescribed carvedilol 25 twice daily and spironolactone.  Echocardiogram improved from 06/03/2019 with ejection fraction at 35 to 40% to 50 to 55% during recent hospitalization.  Discussed the need for daily weight monitoring and compliance of medications.  He is to continue follow-up with cardiology.  Continue to control blood pressure.  Weight loss encouraged.  We will check electrolytes due to medication use including Lasix/furosemide for treatment of CHF.  Refill provided of furosemide.  Continue spironolactone and follow-up with cardiology. - Basic Metabolic Panel - furosemide (LASIX) 40 MG tablet; 40 mg in am and 1/2 pill (20 mg ) in early evening; if weight gain then increase to 2 pills twice daily for 2 days then resume prior dose  Dispense: 75 tablet; Refill: 4  3. Type 2 diabetes mellitus without complication, without long-term current use of insulin (HCC) Hemoglobin A1c of 7.9 on hospitalization 08/19/2019 with last Hgb A1c of 7.8 on 05/06/2019.  Discussed hemoglobin A1c goal of 7 or less to help prevent long-term complications.  Continue Metformin.  He would likely benefit from addition of an agent such as glipizide or glimepiride.  He has been encouraged to bring blood sugar diary to next visit and monitor blood sugars 2-3 times per day.  Continue low carbohydrate diet #- Basic Metabolic Panel - T4 AND TSH  4. Essential hypertension Blood pressure is currently stable and controlled on spironolactone, Lasix hydralazine and isosorbide mononitrate. - furosemide (LASIX) 40 MG tablet; 40 mg in am and 1/2 pill (20 mg ) in early evening; if  weight gain then increase to 2 pills twice daily for 2 days then resume prior dose  Dispense: 75 tablet; Refill: 4  5. Pain in both lower extremities Patient with complaint of bilateral lower extremity pain.  He does have known lumbar radiculopathy.  We will also check T4 and TSH, arthritis panel and CK-MB and follow-up of generalized leg pain.  Continue use of gabapentin. - T4 AND TSH - Arthritis Panel - CKMB  6. Need for follow-up by social worker He expresses some anxiety regarding his overall health.  We will have patient contacted by social worker in follow-up.  7. 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 - loratadine (CLARITIN) 10 MG tablet; Take 1 tablet (10 mg total) by mouth daily. As needed for allergy symptoms  Dispense: 90 tablet; Refill: 3  8. Encounter for long-term current use of medication We will check basic metabolic panel to make sure that creatinine/renal function and electrolytes are within normal due to use of diuretic medication for hypertension/CHF and medication for treatment of diabetes and hypertension.  Patient additionally is on atorvastatin and has complaint of leg pain therefore we will also check CK-MB.  Liver enzymes were normal on comprehensive metabolic panel done 06/22/4399. - Basic Metabolic  Panel - CKMB  9. Hyperlipidemia associated with type 2 diabetes mellitus ([place]) We will check CK-MB and follow-up of patient's complaint of leg pain as patient is on atorvastatin.  He states that he has tried discontinuation of atorvastatin in the past without relief of pain.  He is to continue low-fat diet and efforts at weight loss. - CKMB   Meds ordered this encounter  Medications  . furosemide (LASIX) 40 MG tablet    Sig: 40 mg in am and 1/2 pill (20 mg ) in early evening; if weight gain then  increase to 2 pills twice daily for 2 days then resume prior dose    Dispense:  75 tablet    Refill:  4    Dose change- needs to pick up today  . loratadine (CLARITIN) 10 MG tablet    Sig: Take 1 tablet (10 mg total) by mouth daily. As needed for allergy symptoms    Dispense:  90 tablet    Refill:  3    An After Visit Summary was printed and given to the patient.   Follow-up: Return in about 4 weeks (around 09/24/2019) for chronic issues; ED if symptoms worsen.    Greater than 30 minutes of face to face time was spent with the patient at today's visit discussing his recent and current medical issues including hospitalization, ROS, Exam, assessment and formulation of treatment plan.   Antony Blackbird, MD

## 2019-08-27 NOTE — Progress Notes (Signed)
Chronic Issue,   Med refills

## 2019-08-27 NOTE — Patient Instructions (Signed)
Heart Failure, Self Care Heart failure is a serious condition. This sheet explains things you need to do to take care of yourself at home. To help you stay as healthy as possible, you may be asked to change your diet, take certain medicines, and make other changes in your life. Your doctor may also give you more specific instructions. If you have problems or questions, call your doctor. What are the risks? Having heart failure makes it more likely for you to have some problems. These problems can get worse if you do not take good care of yourself. Problems may include:  Blood clotting problems. This may cause a stroke.  Damage to the kidneys, liver, or lungs.  Abnormal heart rhythms. Supplies needed:  Scale for weighing yourself.  Blood pressure monitor.  Notebook.  Medicines. How to care for yourself when you have heart failure Medicines Take over-the-counter and prescription medicines only as told by your doctor. Take your medicines every day.  Do not stop taking your medicine unless your doctor tells you to do so.  Do not skip any medicines.  Get your prescriptions refilled before you run out of medicine. This is important. Eating and drinking   Eat heart-healthy foods. Talk with a diet specialist (dietitian) to create an eating plan.  Choose foods that: ? Have no trans fat. ? Are low in saturated fat and cholesterol.  Choose healthy foods, such as: ? Fresh or frozen fruits and vegetables. ? Fish. ? Low-fat (lean) meats. ? Legumes, such as beans, peas, and lentils. ? Fat-free or low-fat dairy products. ? Whole-grain foods. ? High-fiber foods.  Limit salt (sodium) if told by your doctor. Ask your diet specialist to tell you which seasonings are healthy for your heart.  Cook in healthy ways instead of frying. Healthy ways of cooking include roasting, grilling, broiling, baking, poaching, steaming, and stir-frying.  Limit how much fluid you drink, if told by your  doctor. Alcohol use  Do not drink alcohol if: ? Your doctor tells you not to drink. ? Your heart was damaged by alcohol, or you have very bad heart failure. ? You are pregnant, may be pregnant, or are planning to become pregnant.  If you drink alcohol: ? Limit how much you use to:  0-1 drink a day for women.  0-2 drinks a day for men. ? Be aware of how much alcohol is in your drink. In the [place] one drink equals one 12 oz bottle of beer (355 mL), one 5 oz glass of wine (148 mL), or one 1 oz glass of hard liquor (44 mL). Lifestyle   Do not use any products that contain nicotine or tobacco, such as cigarettes, e-cigarettes, and chewing tobacco. If you need help quitting, ask your doctor. ? Do not use nicotine gum or patches before talking to your doctor.  Do not use illegal drugs.  Lose weight if told by your doctor.  Do physical activity if told by your doctor. Talk to your doctor before you begin an exercise if: ? You are an older adult. ? You have very bad heart failure.  Learn to manage stress. If you need help, ask your doctor.  Get rehab (rehabilitation) to help you stay independent and to help with your quality of life.  Plan time to rest when you get tired. Check weight and blood pressure   Weigh yourself every day. This will help you to know if fluid is building up in your body. ? Weigh yourself every morning   after you pee (urinate) and before you eat breakfast. ? Wear the same amount of clothing each time. ? Write down your daily weight. Give your record to your doctor.  Check and write down your blood pressure as told by your doctor.  Check your pulse as told by your doctor. Dealing with very hot and very cold weather  If it is very hot: ? Avoid activities that take a lot of energy. ? Use air conditioning or fans, or find a cooler place. ? Avoid caffeine and alcohol. ? Wear clothing that is loose-fitting, lightweight, and light-colored.  If it is very  cold: ? Avoid activities that take a lot of energy. ? Layer your clothes. ? Wear mittens or gloves, a hat, and a scarf when you go outside. ? Avoid alcohol. Follow these instructions at home:  Stay up to date with shots (vaccines). Get pneumococcal and flu (influenza) shots.  Keep all follow-up visits as told by your doctor. This is important. Contact a doctor if:  You gain weight quickly.  You have increasing shortness of breath.  You cannot do your normal activities.  You get tired easily.  You cough a lot.  You don't feel like eating or feel like you may vomit (nauseous).  You become puffy (swell) in your hands, feet, ankles, or belly (abdomen).  You cannot sleep well because it is hard to breathe.  You feel like your heart is beating fast (palpitations).  You get dizzy when you stand up. Get help right away if:  You have trouble breathing.  You or someone else notices a change in your behavior, such as having trouble staying awake.  You have chest pain or discomfort.  You pass out (faint). These symptoms may be an emergency. Do not wait to see if the symptoms will go away. Get medical help right away. Call your local emergency services (911 in the [place] Do not drive yourself to the hospital. Summary  Heart failure is a serious condition. To care for yourself, you may have to change your diet, take medicines, and make other lifestyle changes.  Take your medicines every day. Do not stop taking them unless your doctor tells you to do so.  Eat heart-healthy foods, such as fresh or frozen fruits and vegetables, fish, lean meats, legumes, fat-free or low-fat dairy products, and whole-grain or high-fiber foods.  Ask your doctor if you can drink alcohol. You may have to stop alcohol use if you have very bad heart failure.  Contact your doctor if you gain weight quickly or feel that your heart is beating too fast. Get help right away if you pass out, or have chest pain  or trouble breathing. This information is not intended to replace advice given to you by your health care provider. Make sure you discuss any questions you have with your health care provider. Document Revised: 07/21/2018 Document Reviewed: 07/22/2018 Elsevier Patient Education  2020 [employer]  

## 2019-08-28 LAB — BASIC METABOLIC PANEL WITH GFR
BUN/Creatinine Ratio: 12 (ref 9–20)
BUN: 13 mg/dL (ref 6–24)
CO2: 21 mmol/L (ref 20–29)
Calcium: 8.8 mg/dL (ref 8.7–10.2)
Chloride: 102 mmol/L (ref 96–106)
Creatinine, Ser: 1.09 mg/dL (ref 0.76–1.27)
GFR calc Af Amer: 92 mL/min/1.73
GFR calc non Af Amer: 79 mL/min/1.73
Glucose: 115 mg/dL — ABNORMAL HIGH (ref 65–99)
Potassium: 4.3 mmol/L (ref 3.5–5.2)
Sodium: 139 mmol/L (ref 134–144)

## 2019-08-28 LAB — ARTHRITIS PANEL
Anti Nuclear Antibody (ANA): NEGATIVE
Rheumatoid fact SerPl-aCnc: <10 [IU]/mL (ref 0.0–13.9)
Sed Rate: 13 mm/h (ref 0–15)
Uric Acid: 8 mg/dL (ref 3.8–8.4)

## 2019-08-28 LAB — T4 AND TSH
T4, Total: 7.9 ug/dL (ref 4.5–12.0)
TSH: 2.35 u[IU]/mL (ref 0.450–4.500)

## 2019-08-28 LAB — CREATININE KINASE MB: CK-MB Index: 1.7 ng/mL (ref 0.0–10.4)

## 2019-08-30 ENCOUNTER — Telehealth: Payer: Self-pay | Admitting: Internal Medicine

## 2019-08-30 ENCOUNTER — Telehealth (HOSPITAL_COMMUNITY): Payer: Self-pay | Admitting: Internal Medicine

## 2019-08-30 NOTE — Telephone Encounter (Signed)
Patient calling stating he was in the hospital and had an echo on 08/19/2019. He has an echo scheduled  09/01/2019 and would like to know if he needs to still have it done.

## 2019-08-30 NOTE — Telephone Encounter (Signed)
No VM set up.

## 2019-08-30 NOTE — Telephone Encounter (Signed)
I called patient to cancel echocardiogram per [facility] per Dr. Jacques Navy. I was unable to leave a message due to no voicemail available.

## 2019-09-01 ENCOUNTER — Other Ambulatory Visit (HOSPITAL_COMMUNITY): Payer: Self-pay

## 2019-09-02 NOTE — Telephone Encounter (Signed)
Previous echo scheduled on 05/12- cancelled.

## 2019-09-03 ENCOUNTER — Encounter: Payer: Self-pay | Admitting: *Deleted

## 2019-09-07 ENCOUNTER — Telehealth: Payer: Self-pay | Admitting: Family Medicine

## 2019-09-07 NOTE — Telephone Encounter (Signed)
Patient called into the office and requested to inform pcp that his blood pressure was 159/76 and his blood sugar was 160. The patient complained of dizziness and being light-headed. Please follow up at your earliest convenience.

## 2019-09-07 NOTE — Telephone Encounter (Signed)
He may need to go to the ED if having the worst headache of his life. May also need COVID testing

## 2019-09-09 NOTE — Telephone Encounter (Signed)
Late entry  Called pt on 09/08/2019 early morning. Made aware of MD message / felling a little better / stressed the importance to consider Covid testing and symptoms persist or worsen to have him further evaluated in the UC/ ED. Verbalized understanding

## 2019-09-17 ENCOUNTER — Telehealth: Payer: Self-pay

## 2019-09-17 NOTE — Telephone Encounter (Signed)
Received call from patient / DOB and name verified As instructed by NP/MD called pt/ name and DOB verified/ made aware of results and MD/NP results note. Verbalized understanding "Glucose of 115. Normal thyroid blood work.Negative arthritis panel. Normal CKMB-muscle enzyme test."

## 2019-09-21 ENCOUNTER — Ambulatory Visit (INDEPENDENT_AMBULATORY_CARE_PROVIDER_SITE_OTHER): Payer: Self-pay | Admitting: Internal Medicine

## 2019-09-21 ENCOUNTER — Other Ambulatory Visit: Payer: Self-pay

## 2019-09-21 ENCOUNTER — Encounter: Payer: Self-pay | Admitting: Internal Medicine

## 2019-09-21 VITALS — BP 148/94 | HR 59 | Ht 71.0 in | Wt 286.0 lb

## 2019-09-21 DIAGNOSIS — R400 Somnolence: Secondary | ICD-10-CM

## 2019-09-21 DIAGNOSIS — R0683 Snoring: Secondary | ICD-10-CM

## 2019-09-21 DIAGNOSIS — Z7189 Other specified counseling: Secondary | ICD-10-CM

## 2019-09-21 DIAGNOSIS — I10 Essential (primary) hypertension: Secondary | ICD-10-CM

## 2019-09-21 DIAGNOSIS — I2510 Atherosclerotic heart disease of native coronary artery without angina pectoris: Secondary | ICD-10-CM

## 2019-09-21 DIAGNOSIS — I428 Other cardiomyopathies: Secondary | ICD-10-CM

## 2019-09-21 MED ORDER — ATORVASTATIN CALCIUM 40 MG PO TABS: 40.0000 mg | ORAL_TABLET | Freq: Every day | ORAL | 3 refills | Status: AC

## 2019-09-21 MED FILL — ?ATORVASTATIN 40MG TABLET: 40 | 30 days supply | Qty: 30 | Fill #0

## 2019-09-21 NOTE — Patient Instructions (Signed)
Medication Instructions:  INCREASE atorvastatin (Lipitor) to 40 mg daily  --Weight yourself daily --if weight increases 3- 5 lbs, you become more short of breath or swelling increases:  Ok to Take additional 20 mg furosemide (Lasix) and call our office.   *If you need a refill on your cardiac medications before your next appointment, please call your pharmacy*  Follow-Up: At [facility], you and your health needs are our priority.  As part of our continuing mission to provide you with exceptional heart care, we have created designated Provider Care Teams.  These Care Teams include your primary Cardiologist (physician) and Advanced Practice Providers (APPs -  Physician Assistants and Nurse Practitioners) who all work together to provide you with the care you need, when you need it.  We recommend signing up for the patient portal called "MyChart".  Sign up information is provided on this After Visit Summary.  MyChart is used to connect with patients for Virtual Visits (Telemedicine).  Patients are able to view lab/test results, encounter notes, upcoming appointments, etc.  Non-urgent messages can be sent to your provider as well.   To learn more about what you can do with MyChart, go to [email or web address].    Your next appointment:   3 month(s)  The format for your next appointment:   In Person  Provider:   Weston Brass, MD

## 2019-09-21 NOTE — Progress Notes (Signed)
Cardiology Office Note:    Date:  09/21/2019   ID:  Dustin Nicholson, DOB 24-Jan-1971, MRN [id number]  PCP:  Antony Blackbird, MD  Cardiologist:  Elouise Munroe, MD  Electrophysiologist:  None   Referring MD: Antony Blackbird, MD   Chief Complaint: f/u chest pain, chronic heart failure  History of Present Illness:    Dustin Nicholson is a 49 y.o. male with a history of nonischemic cardiomyopathy with normalization of EF on last echo May 2021, hypertension, dyslipidemia, obesity, diabetes.  He presents for follow-up after hospitalization at the end of April for shortness of breath and constant chest pain.  We were able to obtain the records for his CT coronary angiogram performed at [facility] which showed no obstructive CAD per report.  On presentation to the hospital he had 1+ pitting edema and shortness of breath and appeared to be volume overloaded.  Troponins were mildly elevated but flat not consistent with ACS.  He has T wave inversions on his ECG at baseline this was unchanged in the hospital.  He was diuresed and is feeling much better now.  He continues on Lasix 1-1/2 tabs daily (60 mg) he feels he is doing well on this dose.  He has no lower extremity swelling and no shortness of breath today and appears euvolemic.  He briefly stopped taking atorvastatin due to leg discomfort but his leg discomfort did not improve and he has restarted atorvastatin 20 mg daily.  We discussed up titration today given elevated LDL above goal.  At last visit we increase his spironolactone to 50 mg a day and he is tolerating this well.  He has significant daytime fatigue and snores at night.  We discussed referral for a sleep study.  He denies any current chest pain or shortness of breath.  Past Medical History:  Diagnosis Date  . CHF (congestive heart failure) ([facility])   . 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 Hypertension   . 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  Current Medications: Current Meds  Medication Sig  . amLODipine (NORVASC) 10 MG tablet Take 1 tablet (10 mg total) by mouth daily.  Marland Kitchen aspirin EC 81 MG EC tablet Take 1 tablet (81 mg total) by mouth daily.  Marland Kitchen atorvastatin (LIPITOR) 40 MG tablet Take 1 tablet (40 mg total) by mouth daily.  . Blood Glucose Monitoring Suppl (TRUE METRIX METER) w/Device KIT Use to check BS up to 3 times per day (Patient taking differently: 1 each by Other route in the morning, at noon, and at bedtime. Use to check BS up to 3 times per day)  . carvedilol (COREG) 25 MG tablet Take 1 tablet (25 mg total) by mouth 2 (two) times daily with a meal.  . fluticasone (FLONASE) 50 MCG/ACT nasal spray Place 2 sprays into both nostrils daily.  . furosemide (LASIX) 40 MG tablet 40 mg in am and 1/2 pill (20 mg ) in early evening; if weight gain then increase to 2 pills twice daily for 2 days then resume prior dose  . gabapentin (NEURONTIN) 300 MG capsule Take 1 capsule (300 mg total) by mouth at bedtime.  Marland Kitchen glucose blood (TRUE METRIX BLOOD GLUCOSE TEST) test strip Use as instructed (Patient taking differently: 1 each by Other route in the morning, at noon, and at bedtime. )  . hydrALAZINE (APRESOLINE) 25 MG  tablet Take 1 tablet (25 mg total) by mouth 3 (three) times daily.  . isosorbide mononitrate (IMDUR) 30 MG 24 hr tablet Take 1 tablet (30 mg total) by mouth daily.  Marland Kitchen loratadine (CLARITIN) 10 MG tablet Take 1 tablet (10 mg total) by mouth daily. As needed for allergy symptoms  . metFORMIN (GLUCOPHAGE) 500 MG tablet Take 2 tablets (1,000 mg total) by mouth 2 (two) times daily with a meal.  . nitroGLYCERIN (NITROSTAT) 0.4 MG SL tablet Place 1 tablet (0.4 mg total) under the tongue every 5 (five) minutes x 3 doses as needed for chest pain.  Marland Kitchen spironolactone (ALDACTONE) 50 MG tablet Take 50 mg by mouth daily.  . TRUEplus Lancets 28G MISC Use when  checking blood sugars (Patient taking differently: 1 each by Other route in the morning, at noon, and at bedtime. )  . Vitamin D, Ergocalciferol, (DRISDOL) 1.25 MG (50000 UNIT) CAPS capsule Take 1 capsule (50,000 Units total) by mouth every 7 (seven) days.  . [DISCONTINUED] atorvastatin (LIPITOR) 20 MG tablet Take 1 tablet (20 mg total) by mouth daily.  . [DISCONTINUED] spironolactone (ALDACTONE) 25 MG tablet Take 25 mg twice a day (Patient taking differently: Take 50 mg by mouth once. )     Allergies:   Shellfish allergy   Social History   Socioeconomic History  . Marital status: Significant Other    Spouse name: Not on file  . Number of children: Not on file  . Years of education: Not on file  . Highest education level: Not on file  Occupational History  . Not on file  Tobacco Use  . Smoking status: Never Smoker  . Smokeless tobacco: Never Used  Substance and Sexual Activity  . Alcohol use: Yes    Comment: occ  . Drug use: No  . Sexual activity: Yes  Other Topics Concern  . Not on file  Social History Narrative  . Not on file   Social Determinants of Health   Financial Resource Strain:   . Difficulty of Paying Living Expenses:   Food Insecurity:   . Worried About Charity fundraiser in the Last Year:   . Arboriculturist in the Last Year:   Transportation Needs:   . Film/video editor (Medical):   Marland Kitchen Lack of Transportation (Non-Medical):   Physical Activity:   . Days of Exercise per Week:   . Minutes of Exercise per Session:   Stress:   . Feeling of Stress :   Social Connections:   . Frequency of Communication with Friends and Family:   . Frequency of Social Gatherings with Friends and Family:   . Attends Religious Services:   . Active Member of Clubs or Organizations:   . Attends Archivist Meetings:   Marland Kitchen Marital Status:      Family History: The patient's family history includes Hypertension in his father and mother.  ROS:   Please see the  history of present illness.    All other systems reviewed and are negative.  EKGs/Labs/Other Studies Reviewed:    The following studies were reviewed today:  EKG:  Sinus bradycardia, ST T wave abnormalities laterally.   Recent Labs: 07/28/2019: ALT 18 08/18/2019: B Natriuretic Peptide 47.5 08/19/2019: Hemoglobin 14.6; Platelets 212 08/27/2019: BUN 13; Creatinine, Ser 1.09; Potassium 4.3; Sodium 139; TSH 2.350  Recent Lipid Panel    Component Value Date/Time   CHOL 177 08/19/2019 0658   CHOL 169 05/26/2018 1543   TRIG 129 08/19/2019 0034  HDL 31 (L) 08/19/2019 0658   HDL 39 (L) 05/26/2018 1543   CHOLHDL 5.7 08/19/2019 0658   VLDL 26 08/19/2019 0658   LDLCALC 120 (H) 08/19/2019 0658   LDLCALC 112 (H) 05/26/2018 1543    Physical Exam:    VS:  BP (!) 148/94   Pulse (!) 59   Ht 5' 11"  (1.803 m)   Wt 286 lb (129.7 kg)   BMI 39.89 kg/m     Wt Readings from Last 5 Encounters:  09/21/19 286 lb (129.7 kg)  08/27/19 290 lb 12.8 oz (131.9 kg)  08/19/19 278 lb 4.8 oz (126.2 kg)  08/13/19 293 lb 6.4 oz (133.1 kg)  07/28/19 290 lb 6.4 oz (131.7 kg)     Constitutional: No acute distress Eyes: sclera non-icteric, normal conjunctiva and lids ENMT: normal dentition, moist mucous membranes Cardiovascular: regular rhythm, normal rate, no murmurs. S1 and S2 normal. Radial pulses normal bilaterally. No jugular venous distention.  Respiratory: clear to auscultation bilaterally GI : normal bowel sounds, soft and nontender. No distention.   MSK: extremities warm, well perfused. No edema.  NEURO: grossly nonfocal exam, moves all extremities. PSYCH: alert and oriented x 3, normal mood and affect.   ASSESSMENT:    1. Nonischemic cardiomyopathy ([place])   2. Snoring   3. Daytime sleepiness   4. Essential hypertension   5. Coronary artery disease involving native coronary artery of native heart without angina pectoris   6. Advice given about 2019 novel coronavirus infection    PLAN:     Nonischemic cardiomyopathy ([place])  -Ejection fraction is normalized on most recent echocardiogram.  He is euvolemic today.  Continue carvedilol 25 mg twice daily.  No ACE inhibitor or ARB on board at this time, this may be related to impaired renal function in the past.  Will consider adding if no contraindications.  Continue spironolactone 50 mg a day.  Continue hydralazine and Imdur.  Continue Lasix.   CAD-patient has reported a history of prior MI, but the CT coronary angiogram her report was reviewed by the hospital team and was felt to be nonobstructive.  I have not been able to review this report independently and do not see it scanned into the medical record.  He continues on an aspirin 81 mg a day.  He denies chest pain at this time.  Negative for ACS at his last hospital presentation with chest pain.  We will increase the dose of his atorvastatin today for the benefit of hyperlipidemia.  Snoring - Plan: Split night study Daytime sleepiness - Plan: Split night study  He has excessive daytime fatigue and snores and would like a referral for a sleep study.  This would be appropriate given his history of a nonischemic cardiomyopathy and symptoms.  Essential hypertension - Plan: Split night study Continue amlodipine, carvedilol, Lasix, hydralazine, Imdur, spironolactone.  Blood pressure is well controlled, and is mildly elevated today because he has not taken his medications yet.  Hyperlipidemia-we will increase the dose of atorvastatin to 40 mg a day to improve LDL.  This can be rechecked with PCP or at our next follow-up.  The patient inquires if he should take the COVID-19 vaccine.  We discussed the risks and benefits for his particular condition.  We determined that the risk of contracting COVID-19 infection outweighs any potential minor side effects or risks from vaccination, and he should proceed with vaccination as he so chooses.   Total time of encounter: 30 minutes total time of  encounter, including  25 minutes spent in face-to-face patient care on the date of this encounter. This time includes coordination of care and counseling regarding above mentioned problem list. Remainder of non-face-to-face time involved reviewing chart documents/testing relevant to the patient encounter and documentation in the medical record. I have independently reviewed documentation from referring provider.   Cherlynn Kaiser, MD [place]  [facility]    Medication Adjustments/Labs and Tests Ordered: Current medicines are reviewed at length with the patient today.  Concerns regarding medicines are outlined above.  Orders Placed This Encounter  Procedures  . Split night study   Meds ordered this encounter  Medications  . atorvastatin (LIPITOR) 40 MG tablet    Sig: Take 1 tablet (40 mg total) by mouth daily.    Dispense:  90 tablet    Refill:  3    Dose increase    Patient Instructions  Medication Instructions:  INCREASE atorvastatin (Lipitor) to 40 mg daily  --Weight yourself daily --if weight increases 3- 5 lbs, you become more short of breath or swelling increases:  Ok to Take additional 20 mg furosemide (Lasix) and call our office.   *If you need a refill on your cardiac medications before your next appointment, please call your pharmacy*  Follow-Up: At [facility], you and your health needs are our priority.  As part of our continuing mission to provide you with exceptional heart care, we have created designated Provider Care Teams.  These Care Teams include your primary Cardiologist (physician) and Advanced Practice Providers (APPs -  Physician Assistants and Nurse Practitioners) who all work together to provide you with the care you need, when you need it.  We recommend signing up for the patient portal called "MyChart".  Sign up information is provided on this After Visit Summary.  MyChart is used to connect with patients for Virtual Visits (Telemedicine).   Patients are able to view lab/test results, encounter notes, upcoming appointments, etc.  Non-urgent messages can be sent to your provider as well.   To learn more about what you can do with MyChart, go to [email or web address].    Your next appointment:   3 month(s)  The format for your next appointment:   In Person  Provider:   Cherlynn Kaiser, MD

## 2019-09-27 ENCOUNTER — Other Ambulatory Visit: Payer: Self-pay | Admitting: Family Medicine

## 2019-09-27 DIAGNOSIS — I509 Heart failure, unspecified: Secondary | ICD-10-CM

## 2019-09-27 DIAGNOSIS — I10 Essential (primary) hypertension: Secondary | ICD-10-CM

## 2019-09-27 MED FILL — ?ERGOCALCIFEROL 50000 UNITC: 1.25 MG | 28 days supply | Qty: 4 | Fill #2

## 2019-09-27 NOTE — Addendum Note (Signed)
Addended by: Chana Bode on: 09/27/2019 07:49 AM   Modules accepted: Orders

## 2019-09-30 ENCOUNTER — Ambulatory Visit: Payer: Self-pay | Admitting: Family Medicine

## 2019-10-01 ENCOUNTER — Telehealth: Payer: Self-pay | Admitting: *Deleted

## 2019-10-01 NOTE — Telephone Encounter (Signed)
-----   Message from Harvel Ricks, RN sent at 09/21/2019 11:53 AM EDT ----- Regarding: Sleep study Sleep study ordered per Dr. Jacques Navy (after visit, so scale not completed)  Thanks!

## 2019-10-07 NOTE — Telephone Encounter (Signed)
Patient is scheduled for lab study on 10/31/19. pt is scheduled for COVID screening on 10/29/19 2 p prior to ss..  Patient understands his sleep study will be done at [facility] sleep lab. Patient understands he will receive a sleep packet in a week or so. Patient understands to call if he does not receive the sleep packet in a timely manner. Patient agrees with treatment and thanked me for call.

## 2019-10-21 ENCOUNTER — Telehealth: Payer: Self-pay | Admitting: Licensed Clinical Social Worker

## 2019-10-21 NOTE — Telephone Encounter (Signed)
Call placed to patient regarding [facility] referral. Voicemail was not set-up; therefore, LCSW was unable to leave message.

## 2019-10-22 ENCOUNTER — Ambulatory Visit: Payer: Self-pay | Admitting: Family Medicine

## 2019-10-29 ENCOUNTER — Other Ambulatory Visit (HOSPITAL_COMMUNITY): Payer: Self-pay

## 2019-10-31 ENCOUNTER — Encounter (HOSPITAL_BASED_OUTPATIENT_CLINIC_OR_DEPARTMENT_OTHER): Payer: Self-pay | Admitting: Cardiovascular Disease

## 2019-11-05 ENCOUNTER — Telehealth: Payer: Self-pay | Admitting: Licensed Clinical Social Worker

## 2019-11-05 NOTE — Telephone Encounter (Signed)
Call placed to patient regarding [facility] referral. No answer and voicemail box not set up to accept messages.

## 2019-11-21 ENCOUNTER — Ambulatory Visit (HOSPITAL_BASED_OUTPATIENT_CLINIC_OR_DEPARTMENT_OTHER): Payer: Self-pay | Attending: Internal Medicine | Admitting: Cardiovascular Disease

## 2020-01-03 ENCOUNTER — Ambulatory Visit: Payer: Self-pay | Admitting: Internal Medicine

## 2020-01-03 NOTE — Progress Notes (Deleted)
Cardiology Office Note:    Date:  01/03/2020   ID:  Dorien Chihuahua, DOB 1970/09/26, MRN [id number]  PCP:  Cain Saupe, MD  Cardiologist:  Parke Poisson, MD  Electrophysiologist:  None   Referring MD: Cain Saupe, MD   Chief Complaint/Reason for Referral: ***  History of Present Illness:    Dustin Nicholson is a 49 y.o. male with a history of ***  Past Medical History:  Diagnosis Date  . CHF (congestive heart failure) (HCC)   . 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 Hypertension   . MI (myocardial infarction) ([facility]CC) 2009/2011   Pt states he has had 2 MI's    No past surgical history on file.  Current Medications: No outpatient medications have been marked as taking for the 01/03/20 encounter (Appointment) with Parke Poisson, MD.     Allergies:   Shellfish allergy   Social History   Tobacco Use  . Smoking status: Never Smoker  . Smokeless tobacco: Never Used  Substance Use Topics  . Alcohol use: Yes    Comment: occ  . Drug use: No     Family History: The patient's family history includes Hypertension in his father and mother.  ROS:   Please see the history of present illness.    All other systems reviewed and are negative.  EKGs/Labs/Other Studies Reviewed:    The following studies were reviewed today:  EKG:  ***  I have independently reviewed the images from ***.  Recent Labs: 07/28/2019: ALT 18 08/18/2019: B Natriuretic Peptide 47.5 08/19/2019: Hemoglobin 14.6; Platelets 212 08/27/2019: BUN 13; Creatinine, Ser 1.09; Potassium 4.3; Sodium 139; TSH 2.350  Recent Lipid Panel    Component Value Date/Time   CHOL 177 08/19/2019 0658   CHOL 169 05/26/2018 1543   TRIG 129 08/19/2019 0658   HDL 31 (L) 08/19/2019 0658   HDL 39 (L) 05/26/2018 1543   CHOLHDL 5.7 08/19/2019 0658   VLDL 26 08/19/2019 0658   LDLCALC 120 (H) 08/19/2019 0658   LDLCALC 112 (H) 05/26/2018 1543    Physical Exam:    VS:  There were no vitals taken for  this visit.    Wt Readings from Last 5 Encounters:  09/21/19 286 lb (129.7 kg)  08/27/19 290 lb 12.8 oz (131.9 kg)  08/19/19 278 lb 4.8 oz (126.2 kg)  08/13/19 293 lb 6.4 oz (133.1 kg)  07/28/19 290 lb 6.4 oz (131.7 kg)    Constitutional: No acute distress Eyes: sclera non-icteric, normal conjunctiva and lids ENMT: normal dentition, moist mucous membranes Cardiovascular: regular rhythm, normal rate, no murmurs. S1 and S2 normal. Radial pulses normal bilaterally. No jugular venous distention.  Respiratory: clear to auscultation bilaterally GI : normal bowel sounds, soft and nontender. No distention.   MSK: extremities warm, well perfused. No edema.  NEURO: grossly nonfocal exam, moves all extremities. PSYCH: alert and oriented x 3, normal mood and affect.   ASSESSMENT:    No diagnosis found. PLAN:    No diagnosis found.  Total time of encounter: *** minutes total time of encounter, including *** minutes spent in face-to-face patient care on the date of this encounter. This time includes coordination of care and counseling regarding above mentioned problem list. Remainder of non-face-to-face time involved reviewing chart documents/testing relevant to the patient encounter and documentation in the medical record. I have independently reviewed documentation from referring provider.   Weston Brass, MD   [facility]    Medication Adjustments/Labs and Tests  Ordered: Current medicines are reviewed at length with the patient today.  Concerns regarding medicines are outlined above.  No orders of the defined types were placed in this encounter.  No orders of the defined types were placed in this encounter.   There are no Patient Instructions on file for this visit.

## 2020-07-20 IMAGING — CR DG CHEST 2V
2 series · 2 of 2 positions shown · non-contrast
Comparison: 11/25/2018

CLINICAL DATA: Chest pain

EXAM:
CHEST - 2 VIEW

[chest pa]
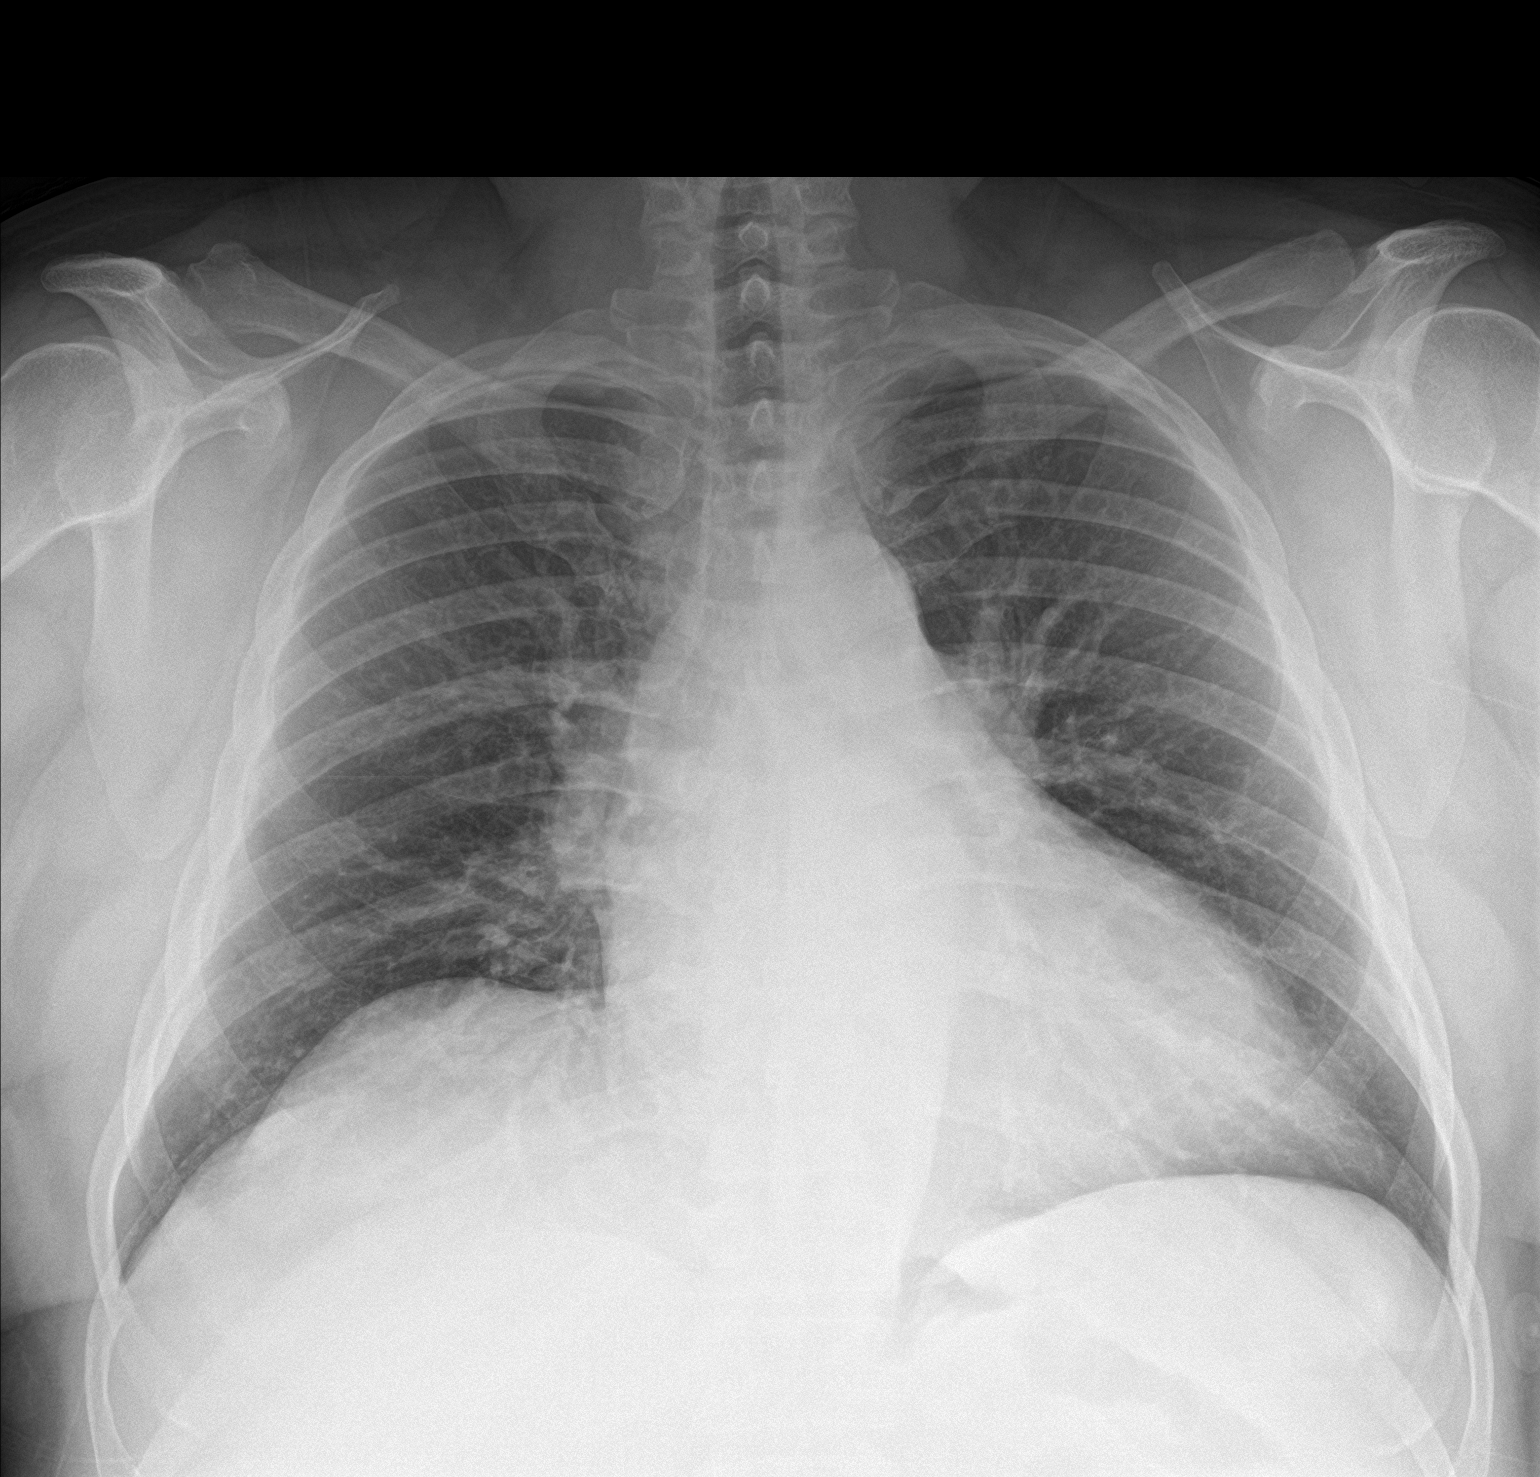

[chest lat]
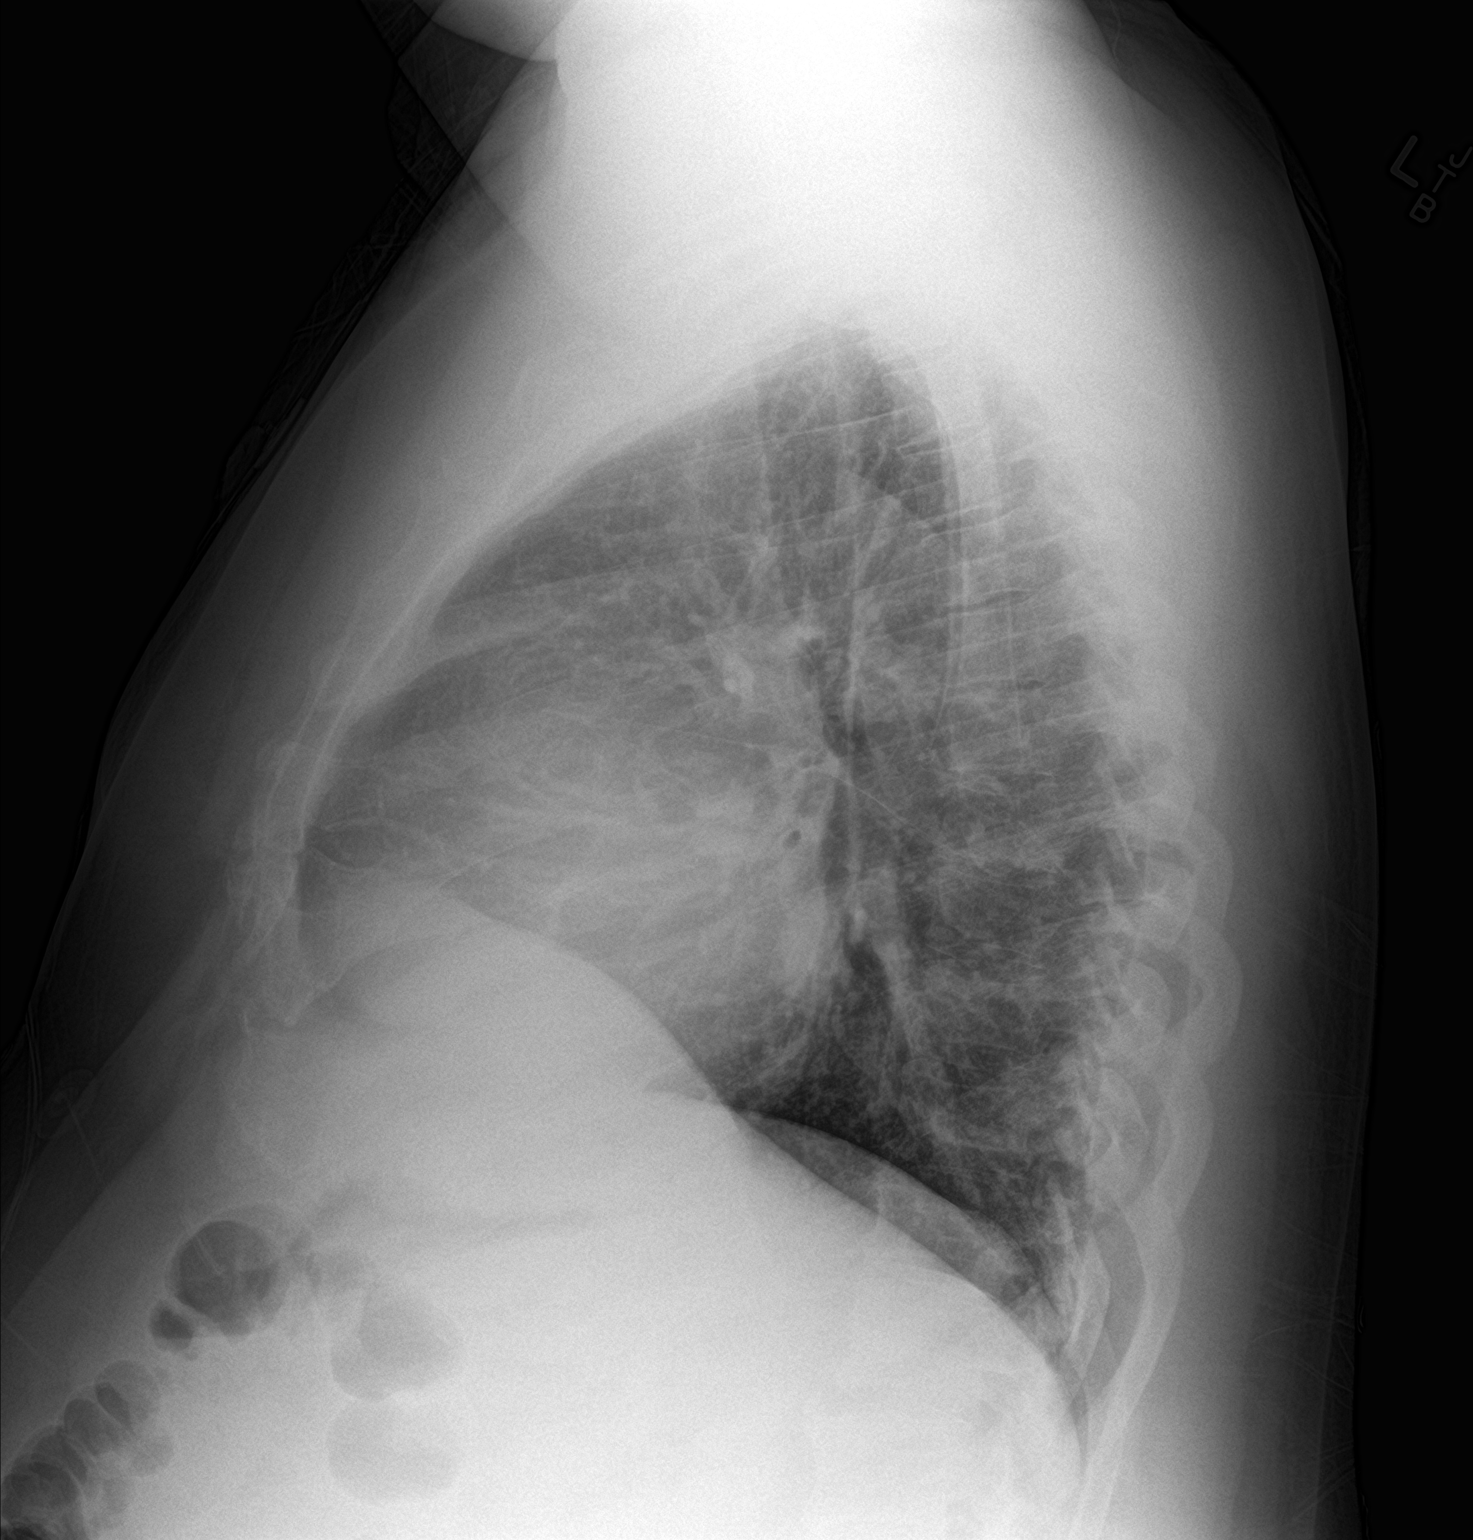

[2 of 2 positions shown; findings below may reference images not displayed]

FINDINGS: Cardiomegaly. No confluent opacities, effusions or edema. No acute
bony abnormality.
IMPRESSION: Cardiomegaly.  No active disease.
